# Patient Record
Sex: Female | Born: 1975 | Race: White | Hispanic: No | Marital: Married | Smoking: Never smoker
Health system: Southern US, Community
[De-identification: ages and names within clinical notes are randomized; demographics above are authoritative.]

## PROBLEM LIST (undated history)

## (undated) DIAGNOSIS — D509 Iron deficiency anemia, unspecified: Secondary | ICD-10-CM

## (undated) DIAGNOSIS — N939 Abnormal uterine and vaginal bleeding, unspecified: Secondary | ICD-10-CM

## (undated) DIAGNOSIS — K649 Unspecified hemorrhoids: Secondary | ICD-10-CM

## (undated) DIAGNOSIS — K449 Diaphragmatic hernia without obstruction or gangrene: Secondary | ICD-10-CM

## (undated) DIAGNOSIS — M79672 Pain in left foot: Secondary | ICD-10-CM

## (undated) DIAGNOSIS — Z9889 Other specified postprocedural states: Secondary | ICD-10-CM

## (undated) DIAGNOSIS — K2210 Ulcer of esophagus without bleeding: Secondary | ICD-10-CM

## (undated) DIAGNOSIS — T7840XA Allergy, unspecified, initial encounter: Secondary | ICD-10-CM

## (undated) DIAGNOSIS — D126 Benign neoplasm of colon, unspecified: Secondary | ICD-10-CM

## (undated) DIAGNOSIS — M722 Plantar fascial fibromatosis: Secondary | ICD-10-CM

## (undated) DIAGNOSIS — O24419 Gestational diabetes mellitus in pregnancy, unspecified control: Secondary | ICD-10-CM

## (undated) DIAGNOSIS — D649 Anemia, unspecified: Secondary | ICD-10-CM

## (undated) DIAGNOSIS — M25561 Pain in right knee: Secondary | ICD-10-CM

## (undated) HISTORY — DX: Plantar fascial fibromatosis: M72.2

## (undated) HISTORY — DX: Benign neoplasm of colon, unspecified: D12.6

## (undated) HISTORY — DX: Allergy, unspecified, initial encounter: T78.40XA

## (undated) HISTORY — DX: Gestational diabetes mellitus in pregnancy, unspecified control: O24.419

## (undated) HISTORY — DX: Pain in right knee: M25.561

## (undated) HISTORY — DX: Diaphragmatic hernia without obstruction or gangrene: K44.9

## (undated) HISTORY — PX: NM RENAL LASIX (ARMC HX): HXRAD1213

## (undated) HISTORY — DX: Ulcer of esophagus without bleeding: K22.10

## (undated) HISTORY — DX: Anemia, unspecified: D64.9

## (undated) HISTORY — DX: Iron deficiency anemia, unspecified: D50.9

## (undated) HISTORY — DX: Pain in left foot: M79.672

## (undated) HISTORY — DX: Abnormal uterine and vaginal bleeding, unspecified: N93.9

## (undated) HISTORY — DX: Unspecified hemorrhoids: K64.9

## (undated) HISTORY — PX: PLANTAR FASCIA RELEASE: SHX2239

## (undated) HISTORY — DX: Other specified postprocedural states: Z98.890

---
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
## 2009-11-21 HISTORY — PX: SHOULDER ARTHROSCOPY: SHX128

## 2015-02-24 LAB — COMPLETE METABOLIC PANEL WITH GFR
Calcium, Ser: 9.6
EGFR (Non-African Amer.): 114

## 2015-02-24 LAB — IBC PANEL
Ferritin: 19
Iron Saturation: 12
Iron: 49

## 2015-02-24 LAB — H. PYLORI ANTIBODY, IGG: H. pylori IgM: <0.4

## 2015-02-24 LAB — BASIC METABOLIC PANEL WITH GFR
BUN: 12 mg/dL (ref 4–21)
Creatinine: 0.7 mg/dL (ref 0.5–1.1)
Glucose: 76 mg/dL
Potassium: 4.4 mmol/L (ref 3.4–5.3)
Sodium: 142 mmol/L (ref 137–147)

## 2015-02-24 LAB — HEPATIC FUNCTION PANEL
ALT: 9 U/L (ref 7–35)
AST: 14 U/L (ref 13–35)

## 2015-02-24 LAB — CBC AND DIFFERENTIAL
HCT: 38 % (ref 36–46)
Hemoglobin: 12.5 g/dL (ref 12.0–16.0)
Platelets: 360 10*3/uL (ref 150–399)
WBC: 9.5 10^3/mL

## 2015-08-18 ENCOUNTER — Ambulatory Visit (INDEPENDENT_AMBULATORY_CARE_PROVIDER_SITE_OTHER): Payer: BLUE CROSS/BLUE SHIELD | Admitting: Osteopathic Medicine

## 2015-08-18 ENCOUNTER — Encounter: Payer: Self-pay | Admitting: Osteopathic Medicine

## 2015-08-18 VITALS — BP 132/81 | HR 75 | Wt 266.0 lb

## 2015-08-18 DIAGNOSIS — K219 Gastro-esophageal reflux disease without esophagitis: Secondary | ICD-10-CM

## 2015-08-18 DIAGNOSIS — Z131 Encounter for screening for diabetes mellitus: Secondary | ICD-10-CM

## 2015-08-18 DIAGNOSIS — K635 Polyp of colon: Secondary | ICD-10-CM

## 2015-08-18 DIAGNOSIS — E669 Obesity, unspecified: Secondary | ICD-10-CM

## 2015-08-18 DIAGNOSIS — D649 Anemia, unspecified: Secondary | ICD-10-CM | POA: Diagnosis not present

## 2015-08-18 DIAGNOSIS — Z79899 Other long term (current) drug therapy: Secondary | ICD-10-CM

## 2015-08-18 DIAGNOSIS — K2100 Gastro-esophageal reflux disease with esophagitis, without bleeding: Secondary | ICD-10-CM

## 2015-08-18 DIAGNOSIS — K449 Diaphragmatic hernia without obstruction or gangrene: Secondary | ICD-10-CM

## 2015-08-18 DIAGNOSIS — Z13220 Encounter for screening for lipoid disorders: Secondary | ICD-10-CM

## 2015-08-18 DIAGNOSIS — D125 Benign neoplasm of sigmoid colon: Secondary | ICD-10-CM

## 2015-08-18 HISTORY — DX: Diaphragmatic hernia without obstruction or gangrene: K44.9

## 2015-08-18 MED ORDER — FOLIVANE-F 125-1 MG PO CAPS: 1.0000 | ORAL_CAPSULE | Freq: Every day | ORAL | Status: DC

## 2015-08-18 MED ORDER — OMEPRAZOLE 40 MG PO CPDR: 40.0000 mg | DELAYED_RELEASE_CAPSULE | Freq: Every day | ORAL | Status: DC

## 2015-08-18 NOTE — Patient Instructions (Addendum)
Omeprazole is generally not a problem in pregnancy but there are safer medicines to consider. If this becomes a concern for you, let Dr Sheppard Coil or your GI secialist know and we will discuss other medications with you.   We will call you with lab results, remember to get you blood drawn fasting (no food/drink other than water or black coffee 8 - 12 hours, can take medications with water). Our lab opens at 8:00 am.   Call us if you have not heard back about a GI referral within one week.   If refills are needed, have your pharmacy send our office a request.

## 2015-08-18 NOTE — Progress Notes (Signed)
HPI: Kelly Bruce is a 39 y.o. female who presents to [facility]  today for chief complaint of:  Chief Complaint  Patient presents with  . Establish Care    Preventive care reviewed as below.  GERD - stable on current meds. Birth control - none, not actively trying to conceive but would be ok with getting pregnant. Monogamous with husband.    Past medical, social and family history reviewed: No past medical history on file. Past Surgical History  Procedure Laterality Date  . Cyst excision Right 1996  . Plantar fascia release Bilateral [id number]  . Shoulder arthroscopy  2011  . Nm  renal lasix  2 24f 2 ([facility] hx)     Social History  Substance Use Topics  . Smoking status: Never Smoker   . Smokeless tobacco: Not on file  . Alcohol Use: 0.6 oz/week    1 Standard drinks or equivalent per week   No family history on file.  Current Outpatient Prescriptions  Medication Sig Dispense Refill  . Fe Fum-FePoly-FA-Vit C-Vit B3 (FOLIVANE-F) 125-1 MG CAPS TAKE 1 CAPSULE(S) EVERY DAY BY ORAL ROUTE FOR 30 DAYS. Pt taking these daily   1  . omeprazole (PRILOSEC) 40 MG capsule TAKE 1 CAPSULE(S) TWICE A DAY BY MOUTH FOR 30 DAYS.  1   No current facility-administered medications for this visit.   Allergies not on file    Review of Systems: CONSTITUTIONAL: Neg fever/chills, no unintentional weight changes HEAD/EYES/EARS/NOSE/THROAT: No headache/vision change or hearing change, no sore throat (+)hay fever/allergies CARDIAC: No chest pain/pressure/palpitations, no orthopnea RESPIRATORY: No cough/shortness of breath/wheeze GASTROINTESTINAL: No nausea/vomiting/abdominal pain/blood in stool/diarrhea/constipation MUSCULOSKELETAL: No myalgia/arthralgia GENITOURINARY: No incontinence, No abnormal genital discharge, reports abnormal bleeding SKIN: No rash/wounds/concerning lesions HEM/ONC: No easy bruising/bleeding, no abnormal lymph node ENDOCRINE: No  polyuria/polydipsia/polyphagia, no heat/cold intolerance  NEUROLOGIC: No weakness/dizzines/slurred speech PSYCHIATRIC: No concerns with depression/anxiety or sleep problems    Exam:  BP 132/81 mmHg  Pulse 75  Wt 266 lb (120.657 kg)  SpO2 98% Constitutional: VSS, see above. General Appearance: alert, well-developed, well-nourished, NAD Eyes: Normal lids and conjunctive, non-icteric sclera, PERRLA Ears, Nose, Mouth, Throat: Normal external inspection ears/nares/mouth/lips/gums, Normal TM bilaterally, MMM, posterior pharynx without erythema/exudate Neck: No masses, trachea midline. No thyroid enlargement/tenderness/mass appreciated Respiratory: Normal respiratory effort. no wheeze/rhonchi/rales Cardiovascular: S1/S2 normal, no murmur/rub/gallop auscultated. RRR. No carotid bruit or JVD. No abdominal aortic bruit. Pedal pulse II/IV bilaterally DP and PT. No lower extremity edema. Gastrointestinal: Nontender, no masses. No hepatomegaly, no splenomegaly. No hernia appreciated. Bowel sounds normal. Rectal exam deferred.  Musculoskeletal: Gait normal. No clubbing/cyanosis of digits.  Neurological: No cranial nerve deficit on limited exam. Motor and sensation intact and symmetric Psychiatric: Normal judgment/insight. Normal mood and affect. Oriented x3.    No results found for this or any previous visit (from the past 72 hour(s)).    ASSESSMENT/PLAN:  Gastroesophageal reflux disease, esophagitis presence not specified - Plan: omeprazole (PRILOSEC) 40 MG capsule, Ambulatory referral to Gastroenterology  Anemia, unspecified anemia type - Plan: Fe Fum-FePoly-FA-Vit C-Vit B3 (FOLIVANE-F) 125-1 MG CAPS, CBC with Differential/Platelet  Lipid screening - Plan: Lipid panel  Diabetes mellitus screening  Medication management - Plan: COMPLETE METABOLIC PANEL WITH GFR  Obesity - Plan: TSH  Reflux esophagitis - Plan: Ambulatory referral to Gastroenterology  Polyp, sigmoid colon  Hiatal  hernia  RTC to discuss abnormal bleeding patterns. LMP 07/25/15.  FEMALE PREVENTIVE CARE  ANNUAL SCREENING/COUNSELING Tobacco - Never  Alcohol - social drinker Diet/Exercise -  HEALTHY HABITS DISCUSSED TO DECREASE CV RISK Sexual Health - Yes with female. STI - The patient denies history of sexually transmitted disease. INTERESTED IN STI TESTING - no Depression - PQH2 Negative Domestic violence concerns - no HTN SCREENING - SEE VITALS Vaccination status - SEE BELOW  INFECTIOUS DISEASE SCREENING HIV - all adults 15-65 - needs but declined GC/CT - sexually active - needs but declined HepC - born 40-1963 - does not need TB - if risk/required by employer - does not need  DISEASE SCREENING Lipid - (Low risk screen M35/F45; High risk screen M25/F35 if HTN, Tob, FH CHD M<55/F<65) - needs DM2 (45+ or Risk = FH 1st deg DM, Hx GDM, overweight/sedentary, high-risk ethnicity, HTN) - needs Osteoporosis - age 44+ or one sooner if risk - does not need  CANCER SCREENING Cervical - Pap q3 yr age 74+, Pap + HPV q5y age 74+ - PAP - does not need, normal 2 years ago Breast - Mammo age 56+ (C) and biennial age 48-75 (A) - 53 - does not need Lung - annual low dose CT Chest age 69-75 w/ 30+ PY, current/quit past 15 years - CT - does not need Colon - age 23+ or 37 years of age prior to [place] Dx - GI REFERRAL - does not need, had Hx polys, needs colonoscopy age 21  ADULT VACCINATION Influenza - annual - was offered and declined by the patient Td booster every 10 years - was offered and declined by the patient HPV - age <35yo - was not indicated Zoster - age 76+ - was not indicated Pneumonia - age 26+ sooner if risk (DM, smoker, other) - was not indicated  OTHER Fall - exercise and Vit D age 15+ - does not need Consider ASA - age 40-59 - does not need

## 2015-08-19 ENCOUNTER — Encounter: Payer: Self-pay | Admitting: Osteopathic Medicine

## 2015-08-19 ENCOUNTER — Encounter: Payer: Self-pay | Admitting: Gastroenterology

## 2015-08-19 DIAGNOSIS — M722 Plantar fascial fibromatosis: Secondary | ICD-10-CM

## 2015-08-19 DIAGNOSIS — R198 Other specified symptoms and signs involving the digestive system and abdomen: Secondary | ICD-10-CM | POA: Insufficient documentation

## 2015-08-19 DIAGNOSIS — Z9889 Other specified postprocedural states: Secondary | ICD-10-CM

## 2015-08-19 HISTORY — DX: Plantar fascial fibromatosis: M72.2

## 2015-08-19 HISTORY — DX: Other specified postprocedural states: Z98.890

## 2015-08-20 LAB — CBC WITH DIFFERENTIAL/PLATELET
Basophils Absolute: 0 10*3/uL (ref 0.0–0.1)
Basophils Relative: 0 % (ref 0–1)
Eosinophils Absolute: 0.1 10*3/uL (ref 0.0–0.7)
Eosinophils Relative: 1 % (ref 0–5)
HCT: 37.7 % (ref 36.0–46.0)
Hemoglobin: 12.3 g/dL (ref 12.0–15.0)
Lymphocytes Relative: 29 % (ref 12–46)
Lymphs Abs: 2.2 10*3/uL (ref 0.7–4.0)
MCH: 24.7 pg — ABNORMAL LOW (ref 26.0–34.0)
MCHC: 32.6 g/dL (ref 30.0–36.0)
MCV: 75.7 fL — ABNORMAL LOW (ref 78.0–100.0)
MPV: 8.9 fL (ref 8.6–12.4)
Monocytes Absolute: 0.6 10*3/uL (ref 0.1–1.0)
Monocytes Relative: 8 % (ref 3–12)
Neutro Abs: 4.8 10*3/uL (ref 1.7–7.7)
Neutrophils Relative %: 62 % (ref 43–77)
Platelets: 291 10*3/uL (ref 150–400)
RBC: 4.98 MIL/uL (ref 3.87–5.11)
RDW: 16.7 % — ABNORMAL HIGH (ref 11.5–15.5)
WBC: 7.7 10*3/uL (ref 4.0–10.5)

## 2015-08-20 LAB — LIPID PANEL
Cholesterol: 175 mg/dL (ref 125–200)
HDL: 45 mg/dL — ABNORMAL LOW (ref >=46)
LDL Cholesterol: 109 mg/dL (ref <130)
Total CHOL/HDL Ratio: 3.9 Ratio (ref <=5.0)
Triglycerides: 105 mg/dL (ref <150)
VLDL: 21 mg/dL (ref <30)

## 2015-08-20 LAB — COMPLETE METABOLIC PANEL WITH GFR
ALT: 11 U/L (ref 6–29)
AST: 13 U/L (ref 10–30)
Albumin: 4 g/dL (ref 3.6–5.1)
Alkaline Phosphatase: 57 U/L (ref 33–115)
BUN: 8 mg/dL (ref 7–25)
CO2: 25 mmol/L (ref 20–31)
Calcium: 9.2 mg/dL (ref 8.6–10.2)
Chloride: 106 mmol/L (ref 98–110)
Creat: 0.68 mg/dL (ref 0.50–1.10)
GFR, Est African American: >89 mL/min (ref >=60)
GFR, Est Non African American: >89 mL/min (ref >=60)
Glucose, Bld: 74 mg/dL (ref 65–99)
Potassium: 4.5 mmol/L (ref 3.5–5.3)
Sodium: 139 mmol/L (ref 135–146)
Total Bilirubin: 0.4 mg/dL (ref 0.2–1.2)
Total Protein: 6.8 g/dL (ref 6.1–8.1)

## 2015-08-21 LAB — TSH: TSH: 1.503 u[IU]/mL (ref 0.350–4.500)

## 2015-10-13 ENCOUNTER — Encounter: Payer: Self-pay | Admitting: Gastroenterology

## 2015-10-13 ENCOUNTER — Ambulatory Visit (INDEPENDENT_AMBULATORY_CARE_PROVIDER_SITE_OTHER): Payer: BLUE CROSS/BLUE SHIELD | Admitting: Gastroenterology

## 2015-10-13 VITALS — BP 130/90 | HR 64 | Ht 64.0 in | Wt 264.2 lb

## 2015-10-13 DIAGNOSIS — K219 Gastro-esophageal reflux disease without esophagitis: Secondary | ICD-10-CM | POA: Diagnosis not present

## 2015-10-13 DIAGNOSIS — D126 Benign neoplasm of colon, unspecified: Secondary | ICD-10-CM

## 2015-10-13 MED ORDER — OMEPRAZOLE 40 MG PO CPDR: 40.0000 mg | DELAYED_RELEASE_CAPSULE | Freq: Every day | ORAL | Status: DC

## 2015-10-13 NOTE — Progress Notes (Signed)
Kelly Bruce    [id number]    [birth year]  Primary Care Physician:Kelly Sheppard Coil, DO  Referring Physician: Emeterio Reeve, DO [id number] [place], [place] [address]  Chief complaint:  GERD  HPI:  39 year old female with history of GERD here to establish care. She was evaluated by Dr. 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 Patient has been on PPI with no symptoms but when she stops it, she has nausea and epigastric pain. Her weight has been stable. She is trying to lose weight. Denies any dysphagia, odynophagia, vomiting, abdominal pain, constipation, diarrhea or blood in stool.  Denies smoking, occasional social alcohol intake and no history of drugs . She works at [employer] .  Outpatient Encounter Prescriptions as of 10/13/2015  Medication Sig  . Fe Fum-FePoly-FA-Vit C-Vit B3 (FOLIVANE-F) 125-1 MG CAPS Take 1 tablet by mouth daily.  Marland Kitchen omeprazole (PRILOSEC) 40 MG capsule Take 1 capsule (40 mg total) by mouth daily.   No facility-administered encounter medications on file as of 10/13/2015.    Allergies as of 10/13/2015  . (No Known Allergies)    Past Medical History  Diagnosis Date  . Allergy   . Vaginal bleeding, abnormal   . Polyp, sigmoid colon 08/18/2015  . Hiatal hernia 08/18/2015    EGD 06/09/14  . H/O shoulder surgery 08/19/2015  . 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 Cyst excision Right 1996  . Plantar fascia release Bilateral [id number]  . Shoulder arthroscopy  2011  . Nm  renal lasix  2 31f 2 ([facility] hx)      Family History  Problem Relation Age of Onset  . Depression Mother   . Mental illness Mother   .  Bipolar disorder Mother   . Hypertension Mother   . Heart attack Father   . Diabetes Father   . Stroke Maternal Grandmother   . Stroke Paternal Grandmother   . Heart attack Paternal Grandfather     Social History   Social History  . Marital Status: Married    Spouse Name: Kelly Bruce  . Number of Children: 0  . 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 Smoking status: Never Smoker   . Smokeless tobacco: Not on file  . Alcohol Use: 0.6 oz/week    1 Standard drinks or equivalent per week  . Drug Use: No  . Sexual Activity:    Partners: Male    Birth Control/ Protection: None   Other Topics Concern  . Not on file   Social History Narrative      Review of systems: Review of Systems  Constitutional: Negative for fever and chills.  HENT: Negative.   Eyes: Negative for blurred vision.  Respiratory: Negative for cough, shortness of breath and wheezing.   Cardiovascular: Negative for chest pain and palpitations.  Gastrointestinal: as per HPI Genitourinary: Negative for dysuria, urgency, frequency and hematuria.  Musculoskeletal: Negative for myalgias, back pain and joint pain.  Skin: Negative for itching and rash.  Neurological: Negative for dizziness, tremors, focal weakness, seizures and loss  of consciousness.  Endo/Heme/Allergies: Negative for environmental allergies.  Psychiatric/Behavioral: Negative for depression, suicidal ideas and hallucinations.  All other systems reviewed and are negative.   Physical Exam: There were no vitals filed for this visit. Gen:      No acute distress, morbidly obese HEENT:  EOMI, sclera anicteric Neck:     No masses; no thyromegaly Lungs:    Clear to auscultation bilaterally; normal respiratory effort CV:         Regular rate and rhythm; no murmurs Abd:      + bowel sounds; soft, non-tender; no palpable masses, no distension Ext:    No edema; adequate  peripheral perfusion Skin:      Warm and dry; no rash Neuro: alert and oriented x 3 Psych: normal mood and affect  Data Reviewed:  EGD and colonoscopy 05/2015 Details as per HPI   Assessment and Plan/Recommendations: 39 year old female with morbid obesity and history of GERD here to establish care Continue PPI daily discussed antireflux measures (no meals 3 hours before bedtime,  sleep with head end elevation) Will schedule for repeat EGD to evaluate for complete healing of esophagitis and gastric ulcer She is due for recall colonoscopy in 5 years Discussed diet and exercise to loose weight If continues to have persistent esophagitis, may have to consider increasing the PPI dose, esophageal manometry with 24-hour pH impedance  Patient is hesitant about long-term PPI use and would like to consider surgical options. We will further discuss after the endoscopy.  Kelly Bruce , MD [phone] Mon-Fri 8a-5p [phone] after 5p, weekends, holidays

## 2015-10-13 NOTE — Patient Instructions (Addendum)
You have been scheduled for an endoscopy. Please follow written instructions given to you at your visit today. If you use inhalers (even only as needed), please bring them with you on the day of your procedure. Your physician has requested that you go to www.startemmi.com and enter the access code given to you at your visit today. This web site gives a general overview about your procedure. However, you should still follow specific instructions given to you by our office regarding your preparation for the procedure.  We will refill your Prilosec Your Follow up with Dr Silverio Decamp after Endoscopy is scheduled on 12-15-2015 at 2:45pm

## 2015-11-04 ENCOUNTER — Encounter: Payer: Self-pay | Admitting: Gastroenterology

## 2015-11-18 ENCOUNTER — Encounter: Payer: Self-pay | Admitting: Gastroenterology

## 2015-11-18 ENCOUNTER — Ambulatory Visit (AMBULATORY_SURGERY_CENTER): Payer: BLUE CROSS/BLUE SHIELD | Admitting: Gastroenterology

## 2015-11-18 ENCOUNTER — Other Ambulatory Visit: Payer: Self-pay | Admitting: Gastroenterology

## 2015-11-18 VITALS — BP 117/73 | HR 76 | Temp 98.9°F | Resp 19 | Ht 64.0 in | Wt 264.0 lb

## 2015-11-18 DIAGNOSIS — K219 Gastro-esophageal reflux disease without esophagitis: Secondary | ICD-10-CM

## 2015-11-18 MED ORDER — SODIUM CHLORIDE 0.9 % IV SOLN: 500.0000 mL | INTRAVENOUS | Status: DC

## 2015-11-18 NOTE — Op Note (Signed)
[facility]  [address] [place], [address]   ENDOSCOPY PROCEDURE REPORT  PATIENT: Kelly Bruce, Kelly Bruce  MR#: [id number] BIRTHDATE: [birth year] , 39  yrs. old GENDER: female ENDOSCOPIST: Harl Bowie, MD REFERRED BY:  Emeterio Reeve, MD PROCEDURE DATE:  11/18/2015 PROCEDURE:  EGD w/ biopsy ASA CLASS:     Class II INDICATIONS:  dyspepsia and heartburn. MEDICATIONS: Propofol 300 mg IV TOPICAL ANESTHETIC: none  DESCRIPTION OF PROCEDURE: After the risks benefits and alternatives of the procedure were thoroughly explained, informed consent was obtained.  The LB [id number] endoscope was introduced through the mouth and advanced to the second portion of the duodenum , Without limitations.  The instrument was slowly withdrawn as the mucosa was fully examined.    Esophageal mucosa appeared normal, widely open Schatzki's ring at squamocolumnar junction at 32 cm from incisors, sliding hiatal hernia but 4 cm in size.  Multiple small gastric polyps 3-5 mm in size, 3 polyps removed for sampling.  Duodenal mucosa appeared normal.  Retroflexed views revealed as previously described. The scope was then withdrawn from the patient and the procedure completed.  COMPLICATIONS: There were no immediate complications.  ENDOSCOPIC IMPRESSION: Esophageal mucosa appeared normal, widely open Schatzki's ring at squamocolumnar junction at 32 cm from incisors, sliding hiatal hernia but 4 cm in size.  Multiple small gastric polyps 3-5 mm in size, 3 polyps removed for sampling.  Duodenal mucosa appeared normal  RECOMMENDATIONS: 1.  Anti-reflux regimen to be follow 2.  Continue PPI 3.  Await pathology results 4.  follow-up office visit 5.Esophageal manometry with 24-hour pH impedance on PPI  eSigned:  Harl Bowie, MD 11/18/2015 2:13 PM

## 2015-11-18 NOTE — Patient Instructions (Signed)
YOU HAD AN ENDOSCOPIC PROCEDURE TODAY AT THE [facility]:   Refer to the procedure report that was given to you for any specific questions about what was found during the examination.  If the procedure report does not answer your questions, please call your gastroenterologist to clarify.  If you requested that your care partner not be given the details of your procedure findings, then the procedure report has been included in a sealed envelope for you to review at your convenience later.  YOU SHOULD EXPECT: Some feelings of bloating in the abdomen. Passage of more gas than usual.  Walking can help get rid of the air that was put into your GI tract during the procedure and reduce the bloating. If you had a lower endoscopy (such as a colonoscopy or flexible sigmoidoscopy) you may notice spotting of blood in your stool or on the toilet paper. If you underwent a bowel prep for your procedure, you may not have a normal bowel movement for a few days.  Please Note:  You might notice some irritation and congestion in your nose or some drainage.  This is from the oxygen used during your procedure.  There is no need for concern and it should clear up in a day or so.  SYMPTOMS TO REPORT IMMEDIATELY:     Following upper endoscopy (EGD)  Vomiting of blood or coffee ground material  New chest pain or pain under the shoulder blades  Painful or persistently difficult swallowing  New shortness of breath  Fever of 100F or higher  Black, tarry-looking stools  For urgent or emergent issues, a gastroenterologist can be reached at any hour by calling [phone].   DIET: Your first meal following the procedure should be a small meal and then it is ok to progress to your normal diet. Heavy or fried foods are harder to digest and may make you feel nauseous or bloated.  Likewise, meals heavy in dairy and vegetables can increase bloating.  Drink plenty of fluids but you should avoid alcoholic beverages  for 24 hours.  ACTIVITY:  You should plan to take it easy for the rest of today and you should NOT DRIVE or use heavy machinery until tomorrow (because of the sedation medicines used during the test).    FOLLOW UP: Our staff will call the number listed on your records the next business day following your procedure to check on you and address any questions or concerns that you may have regarding the information given to you following your procedure. If we do not reach you, we will leave a message.  However, if you are feeling well and you are not experiencing any problems, there is no need to return our call.  We will assume that you have returned to your regular daily activities without incident.  If any biopsies were taken you will be contacted by phone or by letter within the next 1-3 weeks.  Please call us at [phone] if you have not heard about the biopsies in 3 weeks.    SIGNATURES/CONFIDENTIALITY: You and/or your care partner have signed paperwork which will be entered into your electronic medical record.  These signatures attest to the fact that that the information above on your After Visit Summary has been reviewed and is understood.  Full responsibility of the confidentiality of this discharge information lies with you and/or your care-partner.   Resume medications. Information given on Hiatal Hernia. 

## 2015-11-18 NOTE — Progress Notes (Signed)
To recovery, report to Brown, RN, VSS. 

## 2015-11-18 NOTE — Progress Notes (Signed)
Called to room to assist during endoscopic procedure.  Patient ID and intended procedure confirmed with present staff. Received instructions for my participation in the procedure from the performing physician.  

## 2015-11-19 ENCOUNTER — Telehealth: Payer: Self-pay | Admitting: *Deleted

## 2015-11-19 NOTE — Telephone Encounter (Signed)
  Follow up Call-  Call back number 11/18/2015  Post procedure Call Back phone  # [phone]  Permission to leave phone message Yes     Patient questions:  Do you have a fever, pain , or abdominal swelling? No. Pain Score  0 *  Have you tolerated food without any problems? Yes.    Have you been able to return to your normal activities? Yes.    Do you have any questions about your discharge instructions: Diet   No. Medications  No. Follow up visit  No.  Do you have questions or concerns about your Care? No.  Actions: * If pain score is 4 or above: No action needed, pain <4.

## 2015-11-25 ENCOUNTER — Other Ambulatory Visit: Payer: Self-pay | Admitting: Osteopathic Medicine

## 2015-12-01 ENCOUNTER — Encounter: Payer: Self-pay | Admitting: Gastroenterology

## 2015-12-15 ENCOUNTER — Ambulatory Visit: Payer: BLUE CROSS/BLUE SHIELD | Admitting: Gastroenterology

## 2015-12-16 ENCOUNTER — Telehealth: Payer: Self-pay

## 2015-12-16 NOTE — Telephone Encounter (Signed)
Patient is supposed to be scheduled for an esophageal manometry with 24 hour pH impedance on PPI. Left a message to call back. Her EGD was 12/18/14. I have left message for the patient to call back.

## 2016-02-03 ENCOUNTER — Ambulatory Visit: Payer: BLUE CROSS/BLUE SHIELD | Admitting: Gastroenterology

## 2016-02-03 ENCOUNTER — Telehealth: Payer: Self-pay | Admitting: Gastroenterology

## 2016-02-03 NOTE — Telephone Encounter (Signed)
Discussed purpose of the appointment is to recheck GERD and discuss what was found with the testing. Without full information on her causes, it will be difficult to determine the best treatment plan. The hospital cancelled and rescheduled her esophageal manometry. I rescheduled her return appointment with Dr Silverio Decamp.

## 2016-02-22 ENCOUNTER — Ambulatory Visit (HOSPITAL_COMMUNITY)
Admission: RE | Admit: 2016-02-22 | Discharge: 2016-02-22 | Disposition: A | Discharge status: Home / Self Care | Payer: BLUE CROSS/BLUE SHIELD | Source: Ambulatory Visit | Attending: Gastroenterology | Admitting: Gastroenterology

## 2016-02-22 ENCOUNTER — Encounter (HOSPITAL_COMMUNITY)
Admission: RE | Disposition: A | Discharge status: Home / Self Care | Payer: Self-pay | Source: Ambulatory Visit | Attending: Gastroenterology

## 2016-02-22 DIAGNOSIS — R12 Heartburn: Secondary | ICD-10-CM | POA: Diagnosis not present

## 2016-02-22 DIAGNOSIS — R1310 Dysphagia, unspecified: Secondary | ICD-10-CM | POA: Diagnosis not present

## 2016-02-22 DIAGNOSIS — K449 Diaphragmatic hernia without obstruction or gangrene: Secondary | ICD-10-CM | POA: Diagnosis not present

## 2016-02-22 DIAGNOSIS — K219 Gastro-esophageal reflux disease without esophagitis: Secondary | ICD-10-CM

## 2016-02-22 DIAGNOSIS — R079 Chest pain, unspecified: Secondary | ICD-10-CM | POA: Insufficient documentation

## 2016-02-22 HISTORY — PX: ESOPHAGEAL MANOMETRY: SHX5429

## 2016-02-22 HISTORY — PX: PH IMPEDANCE STUDY: SHX5565

## 2016-02-22 SURGERY — MANOMETRY, ESOPHAGUS

## 2016-02-22 MED ORDER — LIDOCAINE VISCOUS 2 % MT SOLN
OROMUCOSAL | Status: AC
  Filled 2016-02-22: qty 15

## 2016-02-22 SURGICAL SUPPLY — 2 items
FACESHIELD LNG OPTICON STERILE (SAFETY) IMPLANT
GLOVE BIO SURGEON STRL SZ8 (GLOVE) ×4 IMPLANT

## 2016-02-24 ENCOUNTER — Encounter (HOSPITAL_COMMUNITY): Payer: Self-pay | Admitting: Gastroenterology

## 2016-02-25 DIAGNOSIS — R1310 Dysphagia, unspecified: Secondary | ICD-10-CM | POA: Insufficient documentation

## 2016-03-03 ENCOUNTER — Other Ambulatory Visit: Payer: Self-pay | Admitting: Osteopathic Medicine

## 2016-03-18 ENCOUNTER — Encounter: Payer: Self-pay | Admitting: Gastroenterology

## 2016-03-18 ENCOUNTER — Ambulatory Visit (INDEPENDENT_AMBULATORY_CARE_PROVIDER_SITE_OTHER): Payer: BLUE CROSS/BLUE SHIELD | Admitting: Gastroenterology

## 2016-03-18 VITALS — BP 110/78 | HR 66 | Ht 65.0 in | Wt 255.0 lb

## 2016-03-18 DIAGNOSIS — K219 Gastro-esophageal reflux disease without esophagitis: Secondary | ICD-10-CM

## 2016-03-18 MED ORDER — OMEPRAZOLE 40 MG PO CPDR: 40.0000 mg | DELAYED_RELEASE_CAPSULE | Freq: Every day | ORAL | Status: DC

## 2016-03-18 NOTE — Patient Instructions (Signed)
Follow up in a year  We will refill your medications

## 2016-03-18 NOTE — Progress Notes (Signed)
Kaycee Yim    [id number]    July 30, 1976  Primary Care Physician:Natalie Sheppard Coil, DO  Referring Physician: Emeterio Reeve, DO [id number] [place], [place] [address]  Chief complaint:  GERD  HPI: 20 yr F with h/o obesity is here for follow up of GERD. She had esophageal manometry that showed hypotensive lower esophageal sphincter and evidence of hiatal hernia otherwise no significant peristaltic abnormality. 24 hr pH impedance on PPI showed good acid suppression and evidence of slightly increased weakly acid reflux. She is doing well on PPI once daily and denies any breakthrough heartburn. Denies any dysphagia, odynophagia,nausea, vomiting, abdominal pain, melena or bright red blood per rectum. She has lost 8 pounds so far and is planning to change her lifestyle to lose more weight    Outpatient Encounter Prescriptions as of 03/18/2016  Medication Sig  . Fe Fum-FePoly-FA-Vit C-Vit B3 (FOLIVANE-F) 125-1 MG CAPS TAKE ONE CAPSULE BY MOUTH EVERY DAY  . omeprazole (PRILOSEC) 40 MG capsule Take 1 capsule (40 mg total) by mouth daily.   No facility-administered encounter medications on file as of 03/18/2016.    Allergies as of 03/18/2016  . (No Known Allergies)    Past Medical History  Diagnosis Date  . Allergy   . Vaginal bleeding, abnormal   . Hiatal hernia 08/18/2015    EGD 06/09/14  . H/O shoulder surgery 08/19/2015  . 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 Adenomatous colon polyp   . Iron deficiency anemia   . Erosive esophagitis   . Hemorrhoids     Past Surgical History  Procedure Laterality Date  . Cyst excision Right 1996    wrist  . Plantar fascia release Bilateral 2006,2007  . Shoulder arthroscopy  2011  . Nm renal lasix ([facility] hx)    . Esophageal manometry N/A 02/22/2016    Procedure: ESOPHAGEAL MANOMETRY (EM);  Surgeon: Mauri Pole, MD;  Location: [facility];  Service: Endoscopy;   Laterality: N/A;  . Ph impedance study N/A 02/22/2016    Procedure: [place] IMPEDANCE STUDY;  Surgeon: Mauri Pole, MD;  Location: [facility];  Service: Endoscopy;  Laterality: N/A;    Family History  Problem Relation Age of Onset  . Depression Mother   . Mental illness Mother   . Bipolar disorder Mother   . Hypertension Mother   . Heart attack Father   . Diabetes Father   . Stroke Maternal Grandmother   . Stroke Paternal Grandmother   . Heart attack Paternal Grandfather     Social History   Social History  . Marital Status: Married    Spouse Name: Azaela Gerbig  . Number of Children: 0  . 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 Smoking status: Never Smoker   . Smokeless tobacco: Never Used  . Alcohol Use: 0.6 oz/week    1 Standard drinks or equivalent per week  . Drug Use: No  . Sexual Activity:    Partners: Male    Birth Control/ Protection: None   Other Topics Concern  . Not on file   Social History Narrative      Review of systems: Review of Systems  Constitutional: Negative for fever and chills.  HENT: Negative.   Eyes: Negative for blurred vision.  Respiratory: Negative for cough, shortness of breath and wheezing.  Cardiovascular: Negative for chest pain and palpitations.  Gastrointestinal: as per HPI Genitourinary: Negative for dysuria, urgency, frequency and hematuria.  Musculoskeletal: Negative for myalgias, back pain and joint pain.  Skin: Negative for itching and rash.  Neurological: Negative for dizziness, tremors, focal weakness, seizures and loss of consciousness.  Endo/Heme/Allergies: Negative for environmental allergies.  Psychiatric/Behavioral: Negative for depression, suicidal ideas and hallucinations.  All other systems reviewed and are negative.   Physical Exam: Filed Vitals:   03/18/16 1139  BP: 110/78  Pulse: 66   Gen:      No acute distress,  obese HEENT:  EOMI, sclera anicteric Neck:     No masses; no thyromegaly Lungs:    Clear to auscultation bilaterally; normal respiratory effort CV:         Regular rate and rhythm; no murmurs Abd:      + bowel sounds; soft, non-tender; no palpable masses, no distension Ext:    No edema; adequate peripheral perfusion Skin:      Warm and dry; no rash Neuro: alert and oriented x 3 Psych: normal mood and affect  Data Reviewed:  Reviewed chart in epic and relevant past GI work up   Assessment and Plan/Recommendations: 29 yr F with obesity and chronic GERD here for follow up visit Her reflux symptoms are well controlled with PPI daily with minimal or only occasional break through symptoms No evidence of ant significant esophageal dysmotility Discussed diet and exercise with goal weight loss ~10% body weigh in next 3-6 months Continue PPI daily and anti reflux measures Return in 1 year  K. Denzil Magnuson , MD [phone] Mon-Fri 8a-5p [phone] after 5p, weekends, holidays

## 2016-05-24 ENCOUNTER — Other Ambulatory Visit: Payer: Self-pay | Admitting: Osteopathic Medicine

## 2016-10-24 ENCOUNTER — Other Ambulatory Visit: Payer: Self-pay | Admitting: Gastroenterology

## 2016-10-24 DIAGNOSIS — K219 Gastro-esophageal reflux disease without esophagitis: Secondary | ICD-10-CM

## 2016-12-26 ENCOUNTER — Telehealth: Payer: Self-pay | Admitting: *Deleted

## 2016-12-26 NOTE — Telephone Encounter (Signed)
Patient request omeprazole 90 day supply  Approved and sent to [facility]

## 2016-12-27 ENCOUNTER — Ambulatory Visit (INDEPENDENT_AMBULATORY_CARE_PROVIDER_SITE_OTHER): Payer: BLUE CROSS/BLUE SHIELD | Admitting: Physician Assistant

## 2016-12-27 VITALS — BP 133/69 | HR 71 | Temp 98.6°F | Wt 251.0 lb

## 2016-12-27 DIAGNOSIS — L03211 Cellulitis of face: Secondary | ICD-10-CM | POA: Diagnosis not present

## 2016-12-27 MED ORDER — CLINDAMYCIN HCL 300 MG PO CAPS: 300.0000 mg | ORAL_CAPSULE | Freq: Three times a day (TID) | ORAL | 0 refills | Status: AC

## 2016-12-27 NOTE — Patient Instructions (Addendum)
Antibiotic: 1 pill, three times daily Warm compresses three times daily Return for fevers, chills, or no improvement after antibiotic treatment  Skin Abscess A skin abscess is an infected area on or under your skin that contains a collection of pus and other material. An abscess may also be called a furuncle, carbuncle, or boil. An abscess can occur in or on almost any part of your body. Some abscesses break open (rupture) on their own. Most continue to get worse unless they are treated. The infection can spread deeper into the body and eventually into your blood, which can make you feel ill. Treatment usually involves draining the abscess. What are the causes? An abscess occurs when germs, often bacteria, pass through your skin and cause an infection. This may be caused by:  A scrape or cut on your skin.  A puncture wound through your skin, including a needle injection.  Blocked oil or sweat glands.  Blocked and infected hair follicles.  A cyst that forms beneath your skin (sebaceous cyst) and becomes infected. What increases the risk? This condition is more likely to develop in people who:  Have a weak body defense system (immune system).  Have diabetes.  Have dry and irritated skin.  Get frequent injections or use illegal IV drugs.  Have a foreign body in a wound, such as a splinter.  Have problems with their lymph system or veins. What are the signs or symptoms? An abscess may start as a painful, firm bump under the skin. Over time, the abscess may get larger or become softer. Pus may appear at the top of the abscess, causing pressure and pain. It may eventually break through the skin and drain. Other symptoms include:  Redness.  Warmth.  Swelling.  Tenderness.  A sore on the skin. How is this diagnosed? This condition is diagnosed based on your medical history and a physical exam. A sample of pus may be taken from the abscess to find out what is causing the  infection and what antibiotics can be used to treat it. You also may have:  Blood tests to look for signs of infection or spread of an infection to your blood.  Imaging studies such as ultrasound, CT scan, or MRI if the abscess is deep. How is this treated? Small abscesses that drain on their own may not need treatment. Treatment for an abscess that does not rupture on its own may include:  Warm compresses applied to the area several times per day.  Incision and drainage. Your health care provider will make an incision to open the abscess and will remove pus and any foreign body or dead tissue. The incision area may be packed with gauze to keep it open for a few days while it heals.  Antibiotic medicines to treat infection. For a severe abscess, you may first get antibiotics through an IV and then change to oral antibiotics. Follow these instructions at home: Abscess Care  If you have an abscess that has not drained, place a warm, clean, wet washcloth over the abscess several times a day. Do this as told by your health care provider.  Follow instructions from your health care provider about how to take care of your abscess. Make sure you:  Cover the abscess with a bandage (dressing).  Change your dressing or gauze as told by your health care provider.  Wash your hands with soap and water before you change the dressing or gauze. If soap and water are not available, use  hand sanitizer.  Check your abscess every day for signs of a worsening infection. Check for:  More redness, swelling, or pain.  More fluid or blood.  Warmth.  More pus or a bad smell. Medicines  Take over-the-counter and prescription medicines only as told by your health care provider.  If you were prescribed an antibiotic medicine, take it as told by your health care provider. Do not stop taking the antibiotic even if you start to feel better. General instructions  To avoid spreading the infection:  Do not  share personal care items, towels, or hot tubs with others.  Avoid making skin contact with other people.  Keep all follow-up visits as told by your health care provider. This is important. Contact a health care provider if:  You have more redness, swelling, or pain around your abscess.  You have more fluid or blood coming from your abscess.  Your abscess feels warm to the touch.  You have more pus or a bad smell coming from your abscess.  You have a fever.  You have muscle aches.  You have chills or a general ill feeling. Get help right away if:  You have severe pain.  You see red streaks on your skin spreading away from the abscess. This information is not intended to replace advice given to you by your health care provider. Make sure you discuss any questions you have with your health care provider. Document Released: 08/17/2005 Document Revised: 07/03/2016 Document Reviewed: 09/16/2015 Elsevier Interactive Patient Education  2017 [employer].

## 2016-12-27 NOTE — Progress Notes (Signed)
HPI:                                                                Kelly Bruce is a 41 y.o. female who presents to [facility]: [facility] today for lesion on left cheek  Patient GA 23w presents with left-sided facial redness, swelling and warmth x 5 days that is gradually worsening. She also endorses tender left-sided lymph node. She has tried topical hydrocortisone cream without relief. Denies fever or chills.   Past Medical History:  Diagnosis Date  . Adenomatous colon polyp   . Allergy   . Erosive esophagitis   . H/O shoulder surgery 08/19/2015  . Hemorrhoids   . Hiatal hernia 08/18/2015   EGD 06/09/14  . Iron deficiency anemia   . 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 Vaginal bleeding, abnormal    Past Surgical History:  Procedure Laterality Date  . CYST EXCISION Right 1996   wrist  . ESOPHAGEAL MANOMETRY N/A 02/22/2016   Procedure: ESOPHAGEAL MANOMETRY (EM);  Surgeon: Mauri Pole, MD;  Location: [facility];  Service: Endoscopy;  Laterality: N/A;  . NM RENAL LASIX ([place] HX)    . [place] IMPEDANCE STUDY N/A 02/22/2016   Procedure: [place] IMPEDANCE STUDY;  Surgeon: Mauri Pole, MD;  Location: [facility];  Service: Endoscopy;  Laterality: N/A;  . PLANTAR FASCIA RELEASE Bilateral [id number]  . SHOULDER ARTHROSCOPY  2011   Social History  Substance Use Topics  . Smoking status: Never Smoker  . Smokeless tobacco: Never Used  . Alcohol use 0.6 oz/week    1 Standard drinks or equivalent per week   family history includes Bipolar disorder in her mother; Depression in her mother; Diabetes in her father; Heart attack in her father and paternal grandfather; Hypertension in her mother; Mental illness in her mother; Stroke in her maternal grandmother and paternal grandmother.  ROS: negative except as noted in the HPI  Medications: Current Outpatient Prescriptions  Medication Sig Dispense Refill  .  Fe Fum-FePoly-FA-Vit C-Vit B3 (FOLIVANE-F) 125-1 MG CAPS TAKE ONE CAPSULE BY MOUTH EVERY DAY 30 capsule 1  . omeprazole (PRILOSEC) 40 MG capsule TAKE ONE CAPSULE BY MOUTH EVERY DAY 30 capsule 2   No current facility-administered medications for this visit.    No Known Allergies     Objective:  BP 133/69   Pulse 71   Temp 98.6 F (37 C) (Oral)   Wt 251 lb (113.9 kg)   BMI 41.77 kg/m  Gen: well-groomed, obese, cooperative, not ill-appearing, no distress HEENT: normal conjunctiva, oropharynx clear, moist mucus membranes, normal buccal mucosa Pulm: Normal work of breathing, normal phonation, clear to auscultation bilaterally, no wheezes, rales or rhonchi CV: Normal rate, regular rhythm, s1 and s2 distinct, no murmurs, clicks or rubs  Neuro: alert and oriented x 3, EOM's intact Lymph: left-sided tonsillar adenopathy present, no preauricular, submandibular, submental or cervical adenopathy Skin: warm and dry, approx 3 cm round area of erythema with fluctuance and warmth on left cheek   No results found for this or any previous visit (from the past 72 hour(s)). No results found.    Assessment and Plan: 41 y.o. female with   1. Cellulitis of left external cheek -  patient may need incision and drainage, but given location and concerns for cosmesis, will defer for now and treat empirically with antibiotics - warm compresses tid - patient instructed to return if worsening symptoms or no improvement with antibiotic therapy - clindamycin (CLEOCIN) 300 MG capsule; Take 1 capsule (300 mg total) by mouth 3 (three) times daily.  Dispense: 21 capsule; Refill: 0  Patient education and anticipatory guidance given Patient agrees with treatment plan Follow-up as needed if symptoms worsen or fail to improve  Darlyne Russian PA-C

## 2017-10-04 ENCOUNTER — Encounter: Payer: Self-pay | Admitting: Osteopathic Medicine

## 2017-10-04 ENCOUNTER — Ambulatory Visit: Payer: BLUE CROSS/BLUE SHIELD | Admitting: Osteopathic Medicine

## 2017-10-04 VITALS — BP 128/87 | HR 69 | Wt 256.0 lb

## 2017-10-04 DIAGNOSIS — M654 Radial styloid tenosynovitis [de Quervain]: Secondary | ICD-10-CM

## 2017-10-04 MED ORDER — DICLOFENAC SODIUM 1 % TD GEL: 2.0000 g | Freq: Four times a day (QID) | TRANSDERMAL | 11 refills | Status: DC

## 2017-10-04 NOTE — Patient Instructions (Signed)
De Quervain Disease De Quervain disease is inflammation of the tendon on the thumb side of the wrist. Tendons are cords of tissue that connect bones to muscles. The tendons in your hand pass through a tunnel, or sheath. A slippery layer of tissue (synovium) lets the tendons move smoothly in the sheath. With de Quervain disease, the sheath swells or thickens, causing friction and pain. The condition is also called de Quervain tendinosis and de Quervain syndrome. It occurs most often in women who are 30-50 years old. What are the causes? The exact cause of de Quervain disease is not known. It may result from:  Overusing your hands, especially with repetitive motions that involve twisting your hand or using a forceful grip.  Pregnancy.  Rheumatoid disease.  What increases the risk? You may have a greater risk for de Quervain disease if you:  Are a middle-aged woman.  Are pregnant.  Have rheumatoid arthritis.  Have diabetes.  Use your hands far more than normal, especially with a tight grip or excessive twisting.  What are the signs or symptoms? Pain on the thumb side of your wrist is the main symptom of de Quervain disease. Other signs and symptoms include:  Pain that gets worse when you grasp something or turn your wrist.  Pain that extends up the forearm.  Cysts in the area of the pain.  Swelling of your wrist and hand.  A sensation of snapping in the wrist.  Trouble moving the thumb and wrist.  How is this diagnosed? Your health care provider may diagnose de Quervain disease based on your signs and symptoms. A physical exam will also be done. A simple test (Finkelstein test) that involves pulling your thumb and wrist to see if this causes pain can help determine whether you have the condition. Sometimes you may need to have an X-ray. How is this treated? Avoiding any activity that causes pain and swelling is the best treatment. Other options include:  Wearing a  splint.  Taking medicine. Anti-inflammatory medicines and corticosteroid injections may reduce inflammation and relieve pain.  Having surgery if other treatments do not work.  Follow these instructions at home:  Using ice can be helpful after doing activities that involve the sore wrist. To apply ice to the injured area: ? Put ice in a plastic bag. ? Place a towel between your skin and the bag. ? Leave the ice on for 20 minutes, 2-3 times a day.  Take medicines only as directed by your health care provider.  Wear your splint as directed. This will allow your hand to rest and heal. Contact a health care provider if:  Your pain medicine does not help.  Your pain gets worse.  You develop new symptoms. This information is not intended to replace advice given to you by your health care provider. Make sure you discuss any questions you have with your health care provider. Document Released: 08/02/2001 Document Revised: 04/14/2016 Document Reviewed: 03/12/2014 Elsevier Interactive Patient Education  2018 [employer]  

## 2017-10-04 NOTE — Progress Notes (Signed)
HPI: Kelly Bruce is a 41 y.o. 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  she presents to [facility], 10/04/17,  for chief complaint of:  Chief Complaint  Patient presents with  . Wrist Pain    LEFT    L wrist pain, ongoing few months but getting worse. Wores when picking up her 35-month old baby. She is using a splint bought OTC which immobilizes the wrist but not the thumb. Tylenol and ice help a bit but condition is worsening despite these interventions.    Past medical, surgical, social and family history reviewed:  Patient Active Problem List   Diagnosis Date Noted  . Dysphagia   . H/O shoulder surgery 08/19/2015  . 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 Irregular bowel habits 08/19/2015  . Esophageal reflux 08/18/2015  . Absolute anemia 08/18/2015  . Obesity 08/18/2015  . Lipid screening 08/18/2015  . Reflux esophagitis 08/18/2015  . Diabetes mellitus screening 08/18/2015  . Medication management 08/18/2015  . Polyp, sigmoid colon 08/18/2015  . Hiatal hernia 08/18/2015    Past Surgical History:  Procedure Laterality Date  . CYST EXCISION Right 1996   wrist  . NM RENAL LASIX ([place] HX)    . PLANTAR FASCIA RELEASE Bilateral [id number]  . SHOULDER ARTHROSCOPY  2011    Social History   Tobacco Use  . Smoking status: Never Smoker  . Smokeless tobacco: Never Used  Substance Use Topics  . Alcohol use: Yes    Alcohol/week: 0.6 oz    Types: 1 Standard drinks or equivalent per week    Family History  Problem Relation Age of Onset  . Depression Mother   . Mental illness Mother   . Bipolar disorder Mother   . Hypertension Mother   . Heart attack Father   . Diabetes Father   . Stroke Maternal Grandmother    . Stroke Paternal Grandmother   . Heart attack Paternal Grandfather      Current medication list and allergy/intolerance information reviewed:    Current Outpatient Medications  Medication Sig Dispense Refill  . Fe Fum-FePoly-FA-Vit C-Vit B3 (FOLIVANE-F) 125-1 MG CAPS TAKE ONE CAPSULE BY MOUTH EVERY DAY 30 capsule 1  . omeprazole (PRILOSEC) 40 MG capsule TAKE ONE CAPSULE BY MOUTH EVERY DAY 30 capsule 2   No current facility-administered medications for this visit.     No Known Allergies    Review of Systems:  Musculoskeletal: +new myalgia/arthralgia  Skin: No  Rash  Neurologic: No  weakness, no numbness    Exam:  BP 128/87   Pulse 69   Wt 256 lb (116.1 kg)   BMI 42.60 kg/m   Constitutional: VS see above. General Appearance: alert, well-developed, well-nourished, NAD  Eyes: Normal lids and conjunctive, non-icteric sclera  Ears, Nose, Mouth, Throat: MMM, Normal external inspection ears/nares/mouth/lips/gums.   Neck: No masses, trachea midline.   Respiratory: Normal respiratory effort.   Musculoskeletal: Gait normal. (+)Finkelstein's test on L, (-) Tinel's on L, normal strength and ROM L wrist and thumb  Neurological: Normal balance/coordination. No tremor.  Motor and sensation intact and symmetric.   Skin: warm, dry, intact. No rash/ulcer.     ASSESSMENT/PLAN:   [name] disease (tenosynovitis) - breastfeeding, minimize meds. Trial voltaren gel. Thumb splint provided, hom eexercise provided. Formal PT referral and/or Sports f/u if needed  Meds ordered this encounter  Medications  . diclofenac sodium (VOLTAREN) 1 % GEL    Sig: Apply 2-4 g 4 (four) times daily topically. To affected joint.    Dispense:  100 g    Refill:  11      Visit summary with medication list and pertinent instructions was printed for patient to review. All questions at time of visit were answered - patient instructed to contact office with any additional concerns. ER/RTC  precautions were reviewed with the patient. Follow-up plan: Return for return visit with sports medicine in 2-4 weeks if no better (Dr. Georgina Snell or Dr. Darene Lamer).     Please note: voice recognition software was used to produce this document, and typos may escape review. Please contact Dr. Sheppard Coil for any needed clarifications.

## 2018-01-03 ENCOUNTER — Other Ambulatory Visit: Payer: Self-pay | Admitting: Gastroenterology

## 2018-01-03 DIAGNOSIS — K219 Gastro-esophageal reflux disease without esophagitis: Secondary | ICD-10-CM

## 2018-06-28 ENCOUNTER — Other Ambulatory Visit: Payer: Self-pay | Admitting: Gastroenterology

## 2018-06-28 DIAGNOSIS — K219 Gastro-esophageal reflux disease without esophagitis: Secondary | ICD-10-CM

## 2018-08-01 ENCOUNTER — Ambulatory Visit (INDEPENDENT_AMBULATORY_CARE_PROVIDER_SITE_OTHER): Payer: PRIVATE HEALTH INSURANCE | Admitting: Osteopathic Medicine

## 2018-08-01 ENCOUNTER — Encounter: Payer: Self-pay | Admitting: Osteopathic Medicine

## 2018-08-01 VITALS — BP 138/58 | HR 92 | Temp 98.1°F | Wt 243.5 lb

## 2018-08-01 DIAGNOSIS — Z0000 Encounter for general adult medical examination without abnormal findings: Secondary | ICD-10-CM

## 2018-08-01 DIAGNOSIS — Z87898 Personal history of other specified conditions: Secondary | ICD-10-CM

## 2018-08-01 DIAGNOSIS — Z8742 Personal history of other diseases of the female genital tract: Secondary | ICD-10-CM

## 2018-08-01 DIAGNOSIS — K219 Gastro-esophageal reflux disease without esophagitis: Secondary | ICD-10-CM

## 2018-08-01 DIAGNOSIS — Z23 Encounter for immunization: Secondary | ICD-10-CM

## 2018-08-01 DIAGNOSIS — Z832 Family history of diseases of the blood and blood-forming organs and certain disorders involving the immune mechanism: Secondary | ICD-10-CM | POA: Diagnosis not present

## 2018-08-01 DIAGNOSIS — D126 Benign neoplasm of colon, unspecified: Secondary | ICD-10-CM

## 2018-08-01 MED ORDER — OMEPRAZOLE 20 MG PO CPDR: 20.0000 mg | DELAYED_RELEASE_CAPSULE | Freq: Every day | ORAL | 3 refills | Status: DC

## 2018-08-01 MED ORDER — RANITIDINE HCL 300 MG PO TABS: 300.0000 mg | ORAL_TABLET | Freq: Every day | ORAL | 3 refills | Status: DC

## 2018-08-01 NOTE — Patient Instructions (Addendum)
General Preventive Care  Most recent routine screening lipids/other labs: ordered today   Tobacco: don't! Alcohol: responsible moderation is ok for most people. Recreational/Illicit Drugs: don't!  Exercise: as tolerated to reduce risk of cardiovascular disease and diabetes  Mental health: if need for mental health care (medicines, counseling, other), or concerns about moods, please let me know!   Sexual health: if need for pregnancy prevention or STD testing, or if concerns about libido/pain, please let me know!   Vaccines  Flu vaccine: recommended every fall (by Halloween!)  Shingles vaccine: Shingrix recommended after age 60  Pneumonia vaccines: Prevnar and Pneumovax recommended after age 64, sooner if diabetes, COPD/asthma, others  Tetanus booster: Tdap recommended every 10 years - you're up to date  Cancer screenings   Colon cancer screening: recommended at age 76, colonoscopy sooner if risk factors   Breast cancer screening: mammogram recommended starting at at age 35  Cervical cancer screening: every 1 to 5 years depending on age and other risk factors.   Infection screenings . HIV: recommended screening at least once age 30-65, more often if risk factors  . Gonorrhea/Chlamydia: screening as needed . Hepatitis C: recommended for anyone born 21-1963  Other . Bone Density Test: recommended for women at age 17, men at age 76, sooner depending on risk factors . Advanced Directive: Living Will and/or Healthcare Power of Attorney recommended for everyone, regardless of age or health . Cholesterol: recommended screening annually . Diabetes: recommended screening annually  . Thyroid and Vitamin D: routine screening not recommended, most insurance will not cover this test

## 2018-08-01 NOTE — Progress Notes (Signed)
HPI: Kelly Bruce is a 42 y.o. 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  she presents to [facility] today, 08/01/18,  for chief complaint of: Annual physical    Patient here for annual physical / wellness exam.  See preventive care reviewed as below.   Follows w/ OBGYN for Pap and Mammo, we do not have most recent results. She has hx ASCUS w/ Neg HPV 10/217 - that's the last I ca nsee in [place] for her.    Additional concerns today include:   Dad recently diagnosed w/ Factor V Leiden deficiency, she'd like testing for this. No Hx VTE.   R eye twitch on occasion, worse w/ stress/lack sleep, no HA/VC.   GERD ok on omeprazole 20 mg, never been on ranitidine     Past medical, surgical, social and family history reviewed:  Patient Active Problem List   Diagnosis Date Noted  . Dysphagia   . H/O shoulder surgery 08/19/2015  . 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 Irregular bowel habits 08/19/2015  . Esophageal reflux 08/18/2015  . Absolute anemia 08/18/2015  . Obesity 08/18/2015  . Lipid screening 08/18/2015  . Reflux esophagitis 08/18/2015  . Diabetes mellitus screening 08/18/2015  . Medication management 08/18/2015  . Polyp, sigmoid colon 08/18/2015  . Hiatal hernia 08/18/2015    Past Surgical History:  Procedure Laterality Date  . CYST EXCISION Right 1996   wrist  . ESOPHAGEAL MANOMETRY N/A 02/22/2016   Procedure: ESOPHAGEAL MANOMETRY (EM);  Surgeon: Mauri Pole, MD;  Location: [facility];  Service: Endoscopy;  Laterality: N/A;  . NM RENAL LASIX ([place] HX)    . [place] IMPEDANCE STUDY N/A 02/22/2016   Procedure: [place] IMPEDANCE STUDY;  Surgeon: Mauri Pole, MD;  Location: [facility];  Service:  Endoscopy;  Laterality: N/A;  . PLANTAR FASCIA RELEASE Bilateral [id number]  . SHOULDER ARTHROSCOPY  2011    Social History   Tobacco Use  . Smoking status: Never Smoker  . Smokeless tobacco: Never Used  Substance Use Topics  . Alcohol use: Yes    Alcohol/week: 1.0 standard drinks    Types: 1 Standard drinks or equivalent per week    Family History  Problem Relation Age of Onset  . Depression Mother   . Mental illness Mother   . Bipolar disorder Mother   . Hypertension Mother   . Heart attack Father   . Diabetes Father   . Stroke Maternal Grandmother   . Stroke Paternal Grandmother   . Heart attack Paternal Grandfather      Current medication list and allergy/intolerance information reviewed:    Current Outpatient Medications  Medication Sig Dispense Refill  . diclofenac sodium (VOLTAREN) 1 % GEL Apply 2-4 g 4 (four) times daily topically. To affected joint. 100 g 11  . Fe Fum-FePoly-FA-Vit C-Vit B3 (FOLIVANE-F) 125-1 MG CAPS TAKE ONE CAPSULE BY MOUTH EVERY DAY 30 capsule 1  . omeprazole (PRILOSEC) 40 MG capsule TAKE ONE CAPSULE BY MOUTH EVERY DAY 90 capsule 1   No current facility-administered medications for this visit.     No Known Allergies    Review of Systems:  Constitutional:  No  fever, no chills, No recent illness, No unintentional weight changes. No significant fatigue.   HEENT: No  headache, no vision change, no hearing change, No sore throat,  No  sinus pressure  Cardiac: No  chest pain, No  pressure, No palpitations, No  Orthopnea  Respiratory:  No  shortness of breath. No  Cough  Gastrointestinal: No  abdominal pain, No  nausea, No  vomiting,  No  blood in stool, No  diarrhea, No  constipation   Musculoskeletal: No new myalgia/arthralgia  Skin: No  Rash, No other wounds/concerning lesions  Genitourinary: No  incontinence, No  abnormal genital bleeding, No abnormal genital discharge  Hem/Onc: No  easy bruising/bleeding, No  abnormal lymph  node  Endocrine: No cold intolerance,  No heat intolerance. No polyuria/polydipsia/polyphagia   Neurologic: No  weakness, No  dizziness, No  slurred speech/focal weakness/facial droop  Psychiatric: No  concerns with depression, No  concerns with anxiety, No sleep problems, No mood problems  Exam:  BP (!) 138/58 (BP Location: Left Arm, Patient Position: Sitting, Cuff Size: Large)   Pulse 92   Temp 98.1 F (36.7 C) (Oral)   Wt 243 lb 8 oz (110.5 kg)   BMI 40.52 kg/m   Constitutional: VS see above. General Appearance: alert, well-developed, well-nourished, NAD  Eyes: Normal lids and conjunctive, non-icteric sclera  Ears, Nose, Mouth, Throat: MMM, Normal external inspection ears/nares/mouth/lips/gums. TM normal bilaterally. Pharynx/tonsils no erythema, no exudate. Nasal mucosa normal.   Neck: No masses, trachea midline. No thyroid enlargement. No tenderness/mass appreciated. No lymphadenopathy  Respiratory: Normal respiratory effort. no wheeze, no rhonchi, no rales  Cardiovascular: S1/S2 normal, no murmur, no rub/gallop auscultated. RRR. No lower extremity edema.   Gastrointestinal: Nontender, no masses. No hepatomegaly, no splenomegaly. No hernia appreciated. Bowel sounds normal. Rectal exam deferred.   Musculoskeletal: Gait normal. No clubbing/cyanosis of digits.   Neurological: Normal balance/coordination. No tremor. No cranial nerve deficit on limited exam. Motor and sensation intact and symmetric. Cerebellar reflexes intact.   Skin: warm, dry, intact. No rash/ulcer. No concerning nevi or subq nodules on limited exam.    Psychiatric: Normal judgment/insight. Normal mood and affect. Oriented x3.      ASSESSMENT/PLAN:   Annual physical exam - Plan: Factor 5 leiden, CBC, COMPLETE METABOLIC PANEL WITH GFR, Lipid panel, TSH  Need for influenza vaccination - Plan: Flu Vaccine QUAD 6+ mos PF IM (Fluarix Quad PF), Factor 5 leiden, CBC, COMPLETE METABOLIC PANEL WITH GFR, Lipid  panel, TSH  Gastroesophageal reflux disease, esophagitis presence not specified - Plan: Factor 5 leiden, CBC, COMPLETE METABOLIC PANEL WITH GFR, Lipid panel, TSH, omeprazole (PRILOSEC) 20 MG capsule, ranitidine (ZANTAC) 300 MG tablet  Family history of factor V Leiden mutation - Plan: Factor 5 leiden, CBC, COMPLETE METABOLIC PANEL WITH GFR, Lipid panel, TSH  History of abnormal cervical Pap smear - following w/ obgyn, pt advised to ask them to send records next time she is there  Adenomatous polyp of colon, unspecified part of colon - following w/ GI    Patient Instructions  General Preventive Care  Most recent routine screening lipids/other labs: ordered today   Tobacco: don't! Alcohol: responsible moderation is ok for most people. Recreational/Illicit Drugs: don't!  Exercise: as tolerated to reduce risk of cardiovascular disease and diabetes  Mental health: if need for mental health care (medicines, counseling, other), or concerns about moods, please let me know!   Sexual health: if need for pregnancy prevention or STD testing, or if concerns about libido/pain, please let me know!   Vaccines  Flu vaccine: recommended every fall (by Halloween!)  Shingles vaccine: Shingrix recommended after age 34  Pneumonia vaccines: Prevnar and Pneumovax recommended after  age 15, sooner if diabetes, COPD/asthma, others  Tetanus booster: Tdap recommended every 10 years - you're up to date  Cancer screenings   Colon cancer screening: recommended at age 73, colonoscopy sooner if risk factors   Breast cancer screening: mammogram recommended starting at at age 36  Cervical cancer screening: every 1 to 5 years depending on age and other risk factors.   Infection screenings . HIV: recommended screening at least once age 47-65, more often if risk factors  . Gonorrhea/Chlamydia: screening as needed . Hepatitis C: recommended for anyone born 97-1963  Other . Bone Density Test: recommended  for women at age 64, men at age 35, sooner depending on risk factors . Advanced Directive: Living Will and/or Healthcare Power of Attorney recommended for everyone, regardless of age or health . Cholesterol: recommended screening annually . Diabetes: recommended screening annually  . Thyroid and Vitamin D: routine screening not recommended, most insurance will not cover this test   Was advised that insurance may not cover F5L testing as par of annual labs.     Visit summary with medication list and pertinent instructions was printed for patient to review. All questions at time of visit were answered - patient instructed to contact office with any additional concerns. ER/RTC precautions were reviewed with the patient.   Follow-up plan: Return in about 1 year (around 08/02/2019) for annual check-up, sooner if needed .    Please note: voice recognition software was used to produce this document, and typos may escape review. Please contact Dr. Sheppard Coil for any needed clarifications.

## 2018-08-15 LAB — CBC
HCT: 37.8 % (ref 35.0–45.0)
Hemoglobin: 12.6 g/dL (ref 11.7–15.5)
MCH: 26.6 pg — ABNORMAL LOW (ref 27.0–33.0)
MCHC: 33.3 g/dL (ref 32.0–36.0)
MCV: 79.9 fL — ABNORMAL LOW (ref 80.0–100.0)
MPV: 9.1 fL (ref 7.5–12.5)
Platelets: 325 10*3/uL (ref 140–400)
RBC: 4.73 10*6/uL (ref 3.80–5.10)
RDW: 12.9 % (ref 11.0–15.0)
WBC: 8.2 10*3/uL (ref 3.8–10.8)

## 2018-08-15 LAB — COMPLETE METABOLIC PANEL WITH GFR
AG Ratio: 1.9 (calc) (ref 1.0–2.5)
ALT: 13 U/L (ref 6–29)
AST: 10 U/L (ref 10–30)
Albumin: 4.3 g/dL (ref 3.6–5.1)
Alkaline phosphatase (APISO): 57 U/L (ref 33–115)
BUN: 7 mg/dL (ref 7–25)
CO2: 25 mmol/L (ref 20–32)
Calcium: 9.4 mg/dL (ref 8.6–10.2)
Chloride: 105 mmol/L (ref 98–110)
Creat: 0.68 mg/dL (ref 0.50–1.10)
GFR, Est African American: 127 mL/min/{1.73_m2} (ref >=60)
GFR, Est Non African American: 109 mL/min/{1.73_m2} (ref >=60)
Globulin: 2.3 g/dL (calc) (ref 1.9–3.7)
Glucose, Bld: 74 mg/dL (ref 65–99)
Potassium: 3.7 mmol/L (ref 3.5–5.3)
Sodium: 141 mmol/L (ref 135–146)
Total Bilirubin: 0.4 mg/dL (ref 0.2–1.2)
Total Protein: 6.6 g/dL (ref 6.1–8.1)

## 2018-08-15 LAB — LIPID PANEL
Cholesterol: 185 mg/dL (ref <200)
HDL: 49 mg/dL — ABNORMAL LOW (ref >50)
LDL Cholesterol (Calc): 114 mg/dL (calc) — ABNORMAL HIGH
Non-HDL Cholesterol (Calc): 136 mg/dL (calc) — ABNORMAL HIGH (ref <130)
Total CHOL/HDL Ratio: 3.8 (calc) (ref <5.0)
Triglycerides: 113 mg/dL (ref <150)

## 2018-08-15 LAB — FACTOR 5 LEIDEN

## 2018-08-15 LAB — TSH: TSH: 1.46 mIU/L

## 2019-07-19 ENCOUNTER — Other Ambulatory Visit: Payer: Self-pay | Admitting: Osteopathic Medicine

## 2019-07-19 DIAGNOSIS — K219 Gastro-esophageal reflux disease without esophagitis: Secondary | ICD-10-CM

## 2019-07-19 NOTE — Telephone Encounter (Signed)
Forwarding medication refill request to PCP for review. 

## 2019-08-05 ENCOUNTER — Encounter: Payer: PRIVATE HEALTH INSURANCE | Admitting: Osteopathic Medicine

## 2019-08-07 ENCOUNTER — Ambulatory Visit (INDEPENDENT_AMBULATORY_CARE_PROVIDER_SITE_OTHER): Payer: BC Managed Care – PPO | Admitting: Osteopathic Medicine

## 2019-08-07 ENCOUNTER — Encounter: Payer: Self-pay | Admitting: Osteopathic Medicine

## 2019-08-07 ENCOUNTER — Other Ambulatory Visit: Payer: Self-pay

## 2019-08-07 VITALS — BP 134/68 | HR 71 | Temp 98.4°F | Wt 242.1 lb

## 2019-08-07 DIAGNOSIS — Z8632 Personal history of gestational diabetes: Secondary | ICD-10-CM | POA: Diagnosis not present

## 2019-08-07 DIAGNOSIS — K219 Gastro-esophageal reflux disease without esophagitis: Secondary | ICD-10-CM

## 2019-08-07 DIAGNOSIS — Z23 Encounter for immunization: Secondary | ICD-10-CM

## 2019-08-07 DIAGNOSIS — D126 Benign neoplasm of colon, unspecified: Secondary | ICD-10-CM | POA: Diagnosis not present

## 2019-08-07 DIAGNOSIS — Z0000 Encounter for general adult medical examination without abnormal findings: Secondary | ICD-10-CM | POA: Diagnosis not present

## 2019-08-07 DIAGNOSIS — Z1239 Encounter for other screening for malignant neoplasm of breast: Secondary | ICD-10-CM

## 2019-08-07 NOTE — Progress Notes (Signed)
HPI: Kelly Bruce is a 43 y.o. 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  she presents to [facility] today, 08/07/19,  for chief complaint of: Annual physical  Patient here for annual physical / wellness exam.  See preventive care reviewed as below.  No complaints or questions today     Past medical, surgical, social and family history reviewed:  Patient Active Problem List   Diagnosis Date Noted  . Dysphagia   . H/O shoulder surgery 08/19/2015  . 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 Irregular bowel habits 08/19/2015  . Esophageal reflux 08/18/2015  . Absolute anemia 08/18/2015  . Obesity 08/18/2015  . Lipid screening 08/18/2015  . Reflux esophagitis 08/18/2015  . Diabetes mellitus screening 08/18/2015  . Medication management 08/18/2015  . Polyp, sigmoid colon 08/18/2015  . Hiatal hernia 08/18/2015    Past Surgical History:  Procedure Laterality Date  . CYST EXCISION Right 1996   wrist  . ESOPHAGEAL MANOMETRY N/A 02/22/2016   Procedure: ESOPHAGEAL MANOMETRY (EM);  Surgeon: Mauri Pole, MD;  [facility];  Service: Endoscopy;  Laterality: N/A;  . NM RENAL LASIX ([place] HX)    . [place] IMPEDANCE STUDY N/A 02/22/2016   Procedure: [place] IMPEDANCE STUDY;  Surgeon: Mauri Pole, MD;  [facility];  Service: Endoscopy;  Laterality: N/A;  . PLANTAR FASCIA RELEASE Bilateral [id number]  . SHOULDER ARTHROSCOPY  2011    Social History   Tobacco Use  . Smoking status: Never Smoker  . Smokeless tobacco: Never Used  Substance Use Topics  . Alcohol use: Yes    Alcohol/week: 1.0 standard drinks    Types: 1 Standard drinks or equivalent per week    Family History  Problem Relation Age of Onset  .  Depression Mother   . Mental illness Mother   . Bipolar disorder Mother   . Hypertension Mother   . Heart attack Father   . Diabetes Father   . Factor V Leiden deficiency Father   . Stroke Maternal Grandmother   . Stroke Paternal Grandmother   . Heart attack Paternal Grandfather      Current medication list and allergy/intolerance information reviewed:    Current Outpatient Medications  Medication Sig Dispense Refill  . omeprazole (PRILOSEC) 20 MG capsule TAKE 1 CAPSULE BY MOUTH EVERY DAY 90 capsule 3  . ranitidine (ZANTAC) 300 MG tablet Take 1 tablet (300 mg total) by mouth at bedtime. (Patient not taking: Reported on 08/07/2019) 90 tablet 3   No current facility-administered medications for this visit.     No Known Allergies    Review of Systems:  Constitutional:  No  fever, no chills, No recent illness, No unintentional weight changes. No significant fatigue.   HEENT: No  headache, no vision change, no hearing change, No sore throat, No  sinus pressure  Cardiac: No  chest pain, No  pressure, No palpitations, No  Orthopnea  Respiratory:  No  shortness of breath. No  Cough  Gastrointestinal: No  abdominal pain, No  nausea, No  vomiting,  No  blood in stool, No  diarrhea, No  constipation   Musculoskeletal: No new myalgia/arthralgia  Skin: No  Rash, No other wounds/concerning lesions  Genitourinary: No  incontinence, No  abnormal genital bleeding, No abnormal genital discharge  Hem/Onc: No  easy bruising/bleeding, No  abnormal lymph node  Endocrine: No cold intolerance,  No heat intolerance. No polyuria/polydipsia/polyphagia   Neurologic: No  weakness, No  dizziness, No  slurred speech/focal weakness/facial droop  Psychiatric: No  concerns with depression, No  concerns with anxiety, No sleep problems, No mood problems  Exam:  BP 134/68 (BP Location: Left Arm, Patient Position: Sitting, Cuff Size: Large)   Pulse 71   Temp 98.4 F (36.9 C) (Oral)   Wt 242 lb 1.6  oz (109.8 kg)   BMI 40.29 kg/m   Constitutional: VS see above. General Appearance: alert, well-developed, well-nourished, NAD  Eyes: Normal lids and conjunctive, non-icteric sclera  Ears, Nose, Mouth, Throat: Mask in place TM normal bilaterally.   Neck: No masses, trachea midline. No thyroid enlargement. No tenderness/mass appreciated. No lymphadenopathy  Respiratory: Normal respiratory effort. no wheeze, no rhonchi, no rales  Cardiovascular: S1/S2 normal, no murmur, no rub/gallop auscultated. RRR. No lower extremity edema.   Gastrointestinal: Nontender, no masses. No hepatomegaly, no splenomegaly. No hernia appreciated. Bowel sounds normal. Rectal exam deferred.   Musculoskeletal: Gait normal. No clubbing/cyanosis of digits.   Neurological: Normal balance/coordination. No tremor. No cranial nerve deficit on limited exam. Motor and sensation intact and symmetric. Cerebellar reflexes intact.   Skin: warm, dry, intact. No rash/ulcer. No concerning nevi or subq nodules on limited exam.    Psychiatric: Normal judgment/insight. Normal mood and affect. Oriented x3.      ASSESSMENT/PLAN: The primary encounter diagnosis was Annual physical exam. Diagnoses of Need for influenza vaccination, Adenomatous polyp of colon, unspecified part of colon, History of gestational diabetes, Gastroesophageal reflux disease, esophagitis presence not specified, and Breast cancer screening were also pertinent to this visit.  . Patient Instructions  General Preventive Care  Most recent routine screening lipids/other labs: ordered!   Everyone should have blood pressure checked once per year.   Tobacco: don't!   Alcohol: responsible moderation is ok for most adults - if you have concerns about your alcohol intake, please talk to me!   Exercise: as tolerated to reduce risk of cardiovascular disease and diabetes. Strength training will also prevent osteoporosis.   Mental health: if need for mental  health care (medicines, counseling, other), or concerns about moods, please let me know!   Sexual health: if need for STD testing, or if concerns with libido/pain problems, please let me know! If you need to discuss your birth control options, please let me know!   Advanced Directive: Living Will and/or Healthcare Power of Attorney recommended for all adults, regardless of age or health.  Vaccines  Flu vaccine: recommended for almost everyone, every fall.   Shingles vaccine: Shingrix recommended after age 27.   Pneumonia vaccines: Prevnar and Pneumovax recommended after age 71, or sooner if certain medical conditions.  Tetanus booster: Tdap recommended every 10 years. You're good until 2028.  Cancer screenings   Colon cancer screening: see below for info for GI   Breast cancer screening: mammogram recommended at age 30 every other year at least, and annually after age 57.   Cervical cancer screening: Pap due!   Lung cancer screening: not needed for non-smokers Infection screenings . HIV: recommended screening at least once age 33-65, more often as needed . Gonorrhea/Chlamydia: screening as needed . Hepatitis C: recommended for anyone born 07-1964 (not needed for you) . TB: certain at-risk patients Other . Bone Density Test: recommended for women at age 54      Visit summary with medication list and pertinent instructions was printed for patient  to review. All questions at time of visit were answered - patient instructed to contact office with any additional concerns. ER/RTC precautions were reviewed with the patient.   Follow-up plan: Return for Pap in the next few weeks. Lab visit fatins that day. .    Please note: voice recognition software was used to produce this document, and typos may escape review. Please contact Dr. Sheppard Coil for any needed clarifications.

## 2019-08-07 NOTE — Patient Instructions (Addendum)
General Preventive Care  Most recent routine screening lipids/other labs: ordered!   Everyone should have blood pressure checked once per year.   Tobacco: don't!   Alcohol: responsible moderation is ok for most adults - if you have concerns about your alcohol intake, please talk to me!   Exercise: as tolerated to reduce risk of cardiovascular disease and diabetes. Strength training will also prevent osteoporosis.   Mental health: if need for mental health care (medicines, counseling, other), or concerns about moods, please let me know!   Sexual health: if need for STD testing, or if concerns with libido/pain problems, please let me know! If you need to discuss your birth control options, please let me know!   Advanced Directive: Living Will and/or Healthcare Power of Attorney recommended for all adults, regardless of age or health.  Vaccines  Flu vaccine: recommended for almost everyone, every fall.   Shingles vaccine: Shingrix recommended after age 89.   Pneumonia vaccines: Prevnar and Pneumovax recommended after age 19, or sooner if certain medical conditions.  Tetanus booster: Tdap recommended every 10 years. You're good until 2028.  Cancer screenings   Colon cancer screening: see below for info for GI   Breast cancer screening: mammogram recommended at age 23 every other year at least, and annually after age 8.   Cervical cancer screening: Pap due!   Lung cancer screening: not needed for non-smokers Infection screenings . HIV: recommended screening at least once age 94-65, more often as needed . Gonorrhea/Chlamydia: screening as needed . Hepatitis C: recommended for anyone born 45-1963 (not needed for you) . TB: certain at-risk patients Other . Bone Density Test: recommended for women at age 27

## 2019-08-29 ENCOUNTER — Other Ambulatory Visit (HOSPITAL_COMMUNITY)
Admission: RE | Admit: 2019-08-29 | Discharge: 2019-08-29 | Disposition: A | Discharge status: Home / Self Care | Payer: BC Managed Care – PPO | Source: Ambulatory Visit | Attending: Osteopathic Medicine | Admitting: Osteopathic Medicine

## 2019-08-29 ENCOUNTER — Other Ambulatory Visit: Payer: Self-pay

## 2019-08-29 ENCOUNTER — Encounter: Payer: Self-pay | Admitting: Osteopathic Medicine

## 2019-08-29 ENCOUNTER — Ambulatory Visit (INDEPENDENT_AMBULATORY_CARE_PROVIDER_SITE_OTHER): Payer: BC Managed Care – PPO | Admitting: Osteopathic Medicine

## 2019-08-29 VITALS — BP 114/73 | HR 58 | Temp 98.9°F | Wt 241.4 lb

## 2019-08-29 DIAGNOSIS — Z124 Encounter for screening for malignant neoplasm of cervix: Secondary | ICD-10-CM | POA: Insufficient documentation

## 2019-08-29 NOTE — Progress Notes (Signed)
HPI: Kelly Bruce is a 43 y.o. 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  she presents to [facility] today, 08/29/19,  for chief complaint of:  Pap   Here today just for pelvic/pap  No other complaints or questions Has gotten her blood drawn today    At today's visit 08/29/19 ... PMH, PSH, FH reviewed and updated as needed.  Current medication list and allergy/intolerance hx reviewed and updated as needed. (See remainder of HPI, ROS, Phys Exam below)   No results found.  No results found for this or any previous visit (from the past 72 hour(s)).        ASSESSMENT/PLAN: The encounter diagnosis was Cervical cancer screening.       Follow-up plan: Return in about 1 year (around 08/28/2020) for [place] (call week prior to visit for lab orders), sooner if needed.(pending labs results and Pap results)                                                  ################################################# ################################################# ################################################# #################################################    Current Meds  Medication Sig  . omeprazole (PRILOSEC) 20 MG capsule TAKE 1 CAPSULE BY MOUTH EVERY DAY  . ranitidine (ZANTAC) 300 MG tablet Take 1 tablet (300 mg total) by mouth at bedtime.    No Known Allergies     Review of Systems:  Constitutional: No recent illness   Exam:  BP 114/73 (BP Location: Left Arm, Patient Position: Sitting, Cuff Size: Normal)   Pulse (!) 58   Temp 98.9 F (37.2 C) (Oral)   Wt 241 lb 6.4 oz (109.5 kg)   BMI 40.17 kg/m   Constitutional: VS see above. General Appearance: alert,  well-developed, well-nourished, NAD  Abdominal: non-tender, non-distended  Neurological: Normal balance/coordination. No tremor.  Skin: warm, dry, intact.   Psychiatric: Normal judgment/insight. Normal mood and affect. Oriented x3.   GYN: No lesions/ulcers to external genitalia, normal urethra, normal vaginal mucosa, physiologic discharge, cervix mildly friable, small cyst at about 11:00, uterus not enlarged or tender, adnexa no masses and nontender  BREAST: No rashes/skin changes, normal fibrous breast tissue, no masses or tenderness, normal nipple without discharge, normal axilla      Visit summary with medication list and pertinent instructions was printed for patient to review, patient was advised to alert [place] if any updates are needed. All questions at time of visit were answered - patient instructed to contact office with any additional concerns. ER/RTC precautions were reviewed with the patient and understanding verbalized.   Note: Total time spent 10 minutes, greater than 50% of the visit was spent face-to-face counseling and coordinating care for the following: The encounter diagnosis was Cervical cancer screening..  Please note: voice recognition software was used to produce this document, and typos may escape review. Please contact Dr. Sheppard Coil for any needed clarifications.    Follow up plan: Return in about 1 year (around 08/28/2020) for [place] (call week prior to visit for lab orders), sooner if needed.

## 2019-08-30 LAB — COMPLETE METABOLIC PANEL WITH GFR
AG Ratio: 1.4 (calc) (ref 1.0–2.5)
ALT: 8 U/L (ref 6–29)
AST: 11 U/L (ref 10–30)
Albumin: 4.3 g/dL (ref 3.6–5.1)
Alkaline phosphatase (APISO): 56 U/L (ref 31–125)
BUN: 13 mg/dL (ref 7–25)
CO2: 24 mmol/L (ref 20–32)
Calcium: 9.6 mg/dL (ref 8.6–10.2)
Chloride: 105 mmol/L (ref 98–110)
Creat: 0.75 mg/dL (ref 0.50–1.10)
GFR, Est African American: 115 mL/min/{1.73_m2} (ref >=60)
GFR, Est Non African American: 99 mL/min/{1.73_m2} (ref >=60)
Globulin: 3 g/dL (calc) (ref 1.9–3.7)
Glucose, Bld: 82 mg/dL (ref 65–99)
Potassium: 4.1 mmol/L (ref 3.5–5.3)
Sodium: 139 mmol/L (ref 135–146)
Total Bilirubin: 0.4 mg/dL (ref 0.2–1.2)
Total Protein: 7.3 g/dL (ref 6.1–8.1)

## 2019-08-30 LAB — HEMOGLOBIN A1C
Hgb A1c MFr Bld: 5.7 % of total Hgb — ABNORMAL HIGH (ref <5.7)
Mean Plasma Glucose: 117 (calc)
eAG (mmol/L): 6.5 (calc)

## 2019-08-30 LAB — CBC
HCT: 36.5 % (ref 35.0–45.0)
Hemoglobin: 11.3 g/dL — ABNORMAL LOW (ref 11.7–15.5)
MCH: 23.1 pg — ABNORMAL LOW (ref 27.0–33.0)
MCHC: 31 g/dL — ABNORMAL LOW (ref 32.0–36.0)
MCV: 74.6 fL — ABNORMAL LOW (ref 80.0–100.0)
MPV: 9.2 fL (ref 7.5–12.5)
Platelets: 331 10*3/uL (ref 140–400)
RBC: 4.89 10*6/uL (ref 3.80–5.10)
RDW: 14.5 % (ref 11.0–15.0)
WBC: 7.5 10*3/uL (ref 3.8–10.8)

## 2019-08-30 LAB — LIPID PANEL
Cholesterol: 209 mg/dL — ABNORMAL HIGH (ref <200)
HDL: 55 mg/dL (ref >=50)
LDL Cholesterol (Calc): 134 mg/dL (calc) — ABNORMAL HIGH
Non-HDL Cholesterol (Calc): 154 mg/dL (calc) — ABNORMAL HIGH (ref <130)
Total CHOL/HDL Ratio: 3.8 (calc) (ref <5.0)
Triglycerides: 97 mg/dL (ref <150)

## 2019-08-30 LAB — TSH: TSH: 1.59 mIU/L

## 2019-09-05 ENCOUNTER — Ambulatory Visit (INDEPENDENT_AMBULATORY_CARE_PROVIDER_SITE_OTHER): Payer: BC Managed Care – PPO

## 2019-09-05 ENCOUNTER — Other Ambulatory Visit: Payer: Self-pay

## 2019-09-05 DIAGNOSIS — Z1239 Encounter for other screening for malignant neoplasm of breast: Secondary | ICD-10-CM | POA: Diagnosis not present

## 2019-09-05 DIAGNOSIS — Z0000 Encounter for general adult medical examination without abnormal findings: Secondary | ICD-10-CM

## 2019-09-09 ENCOUNTER — Other Ambulatory Visit: Payer: Self-pay | Admitting: Osteopathic Medicine

## 2019-09-09 DIAGNOSIS — R928 Other abnormal and inconclusive findings on diagnostic imaging of breast: Secondary | ICD-10-CM

## 2019-09-10 LAB — CYTOLOGY - PAP
Comment: NEGATIVE
Diagnosis: NEGATIVE
High risk HPV: NEGATIVE

## 2019-09-12 ENCOUNTER — Ambulatory Visit: Payer: BC Managed Care – PPO

## 2019-09-12 ENCOUNTER — Ambulatory Visit
Admission: RE | Admit: 2019-09-12 | Discharge: 2019-09-12 | Disposition: A | Discharge status: Home / Self Care | Payer: BC Managed Care – PPO | Source: Ambulatory Visit | Attending: Osteopathic Medicine | Admitting: Osteopathic Medicine

## 2019-09-12 ENCOUNTER — Other Ambulatory Visit: Payer: Self-pay

## 2019-09-12 DIAGNOSIS — R928 Other abnormal and inconclusive findings on diagnostic imaging of breast: Secondary | ICD-10-CM

## 2020-02-07 IMAGING — MG DIGITAL SCREENING BILAT W/ TOMO W/ CAD
6 of 10 series · 6 of 30 positions shown · non-contrast
Comparison: None

CLINICAL DATA: Screening.

EXAM:
DIGITAL SCREENING BILATERAL MAMMOGRAM WITH TOMO AND CAD

[L CC synth-2D]
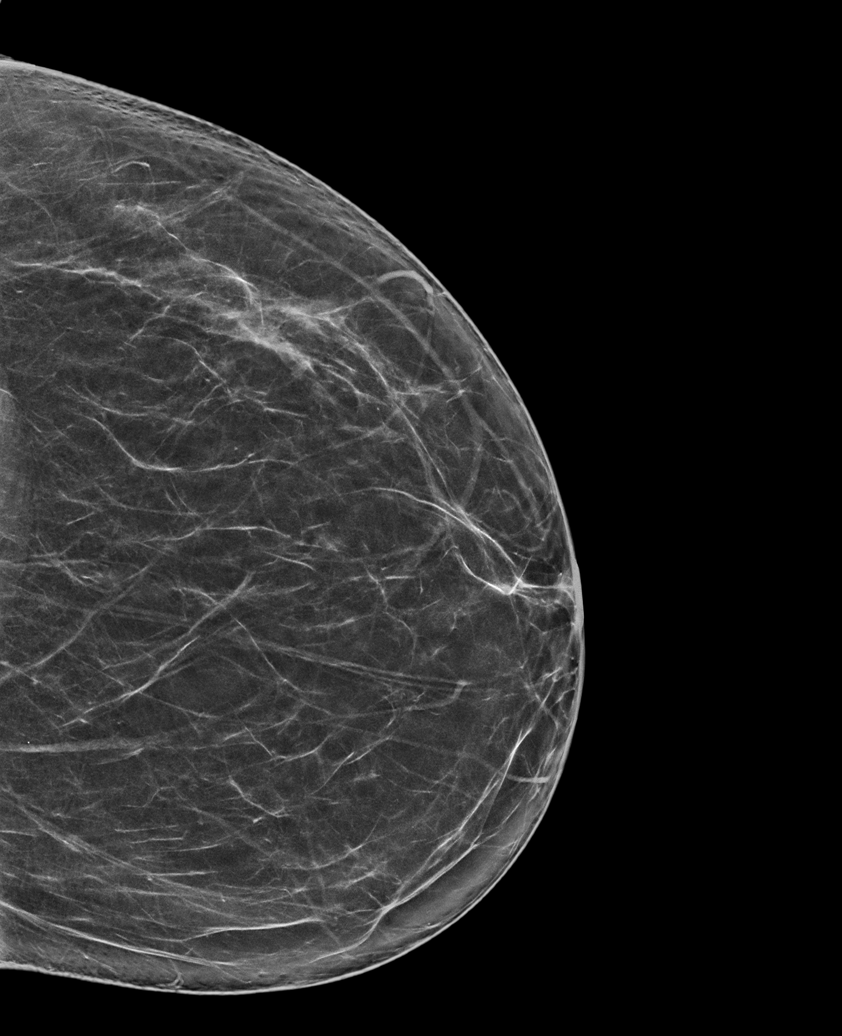

[L MLO synth-2D]
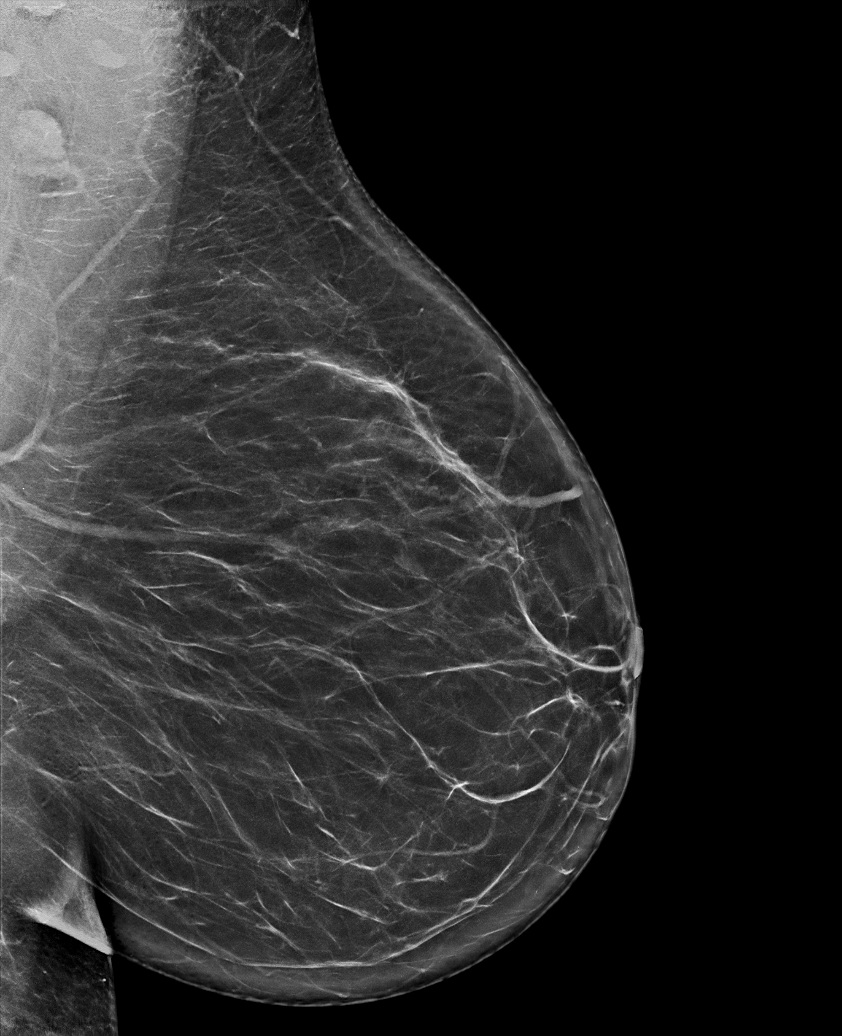

[R MLO synth-2D]
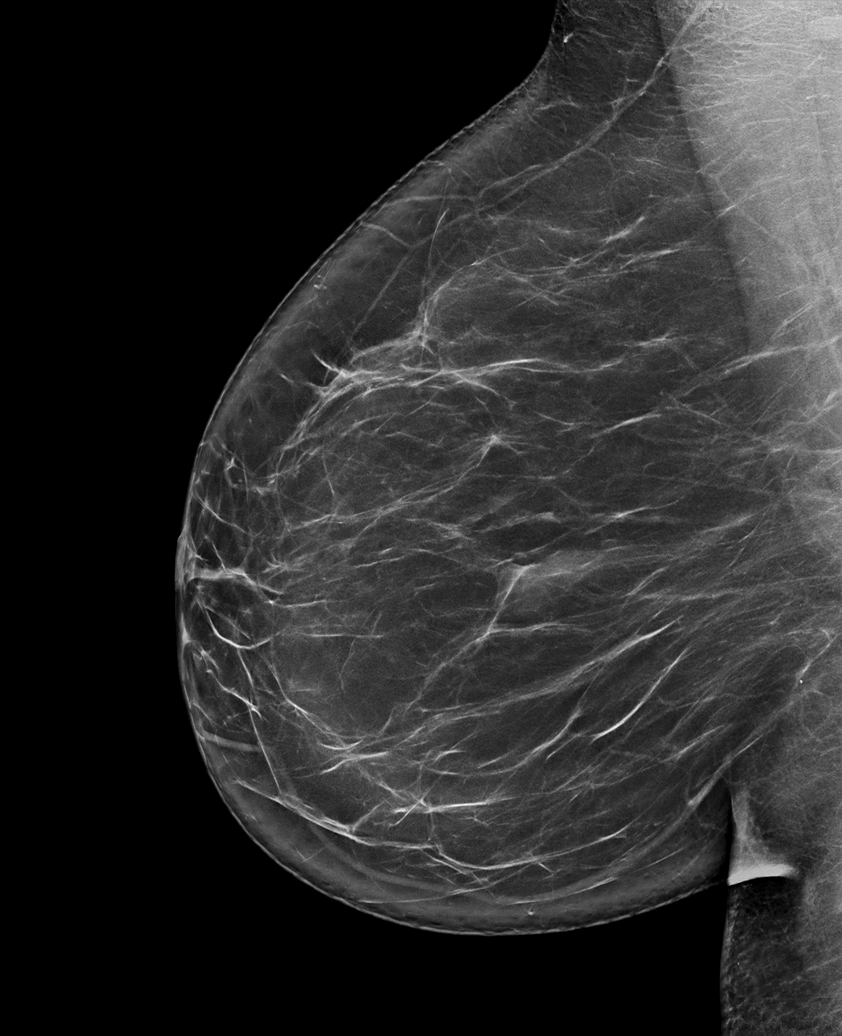

[R XCCL synth-2D]
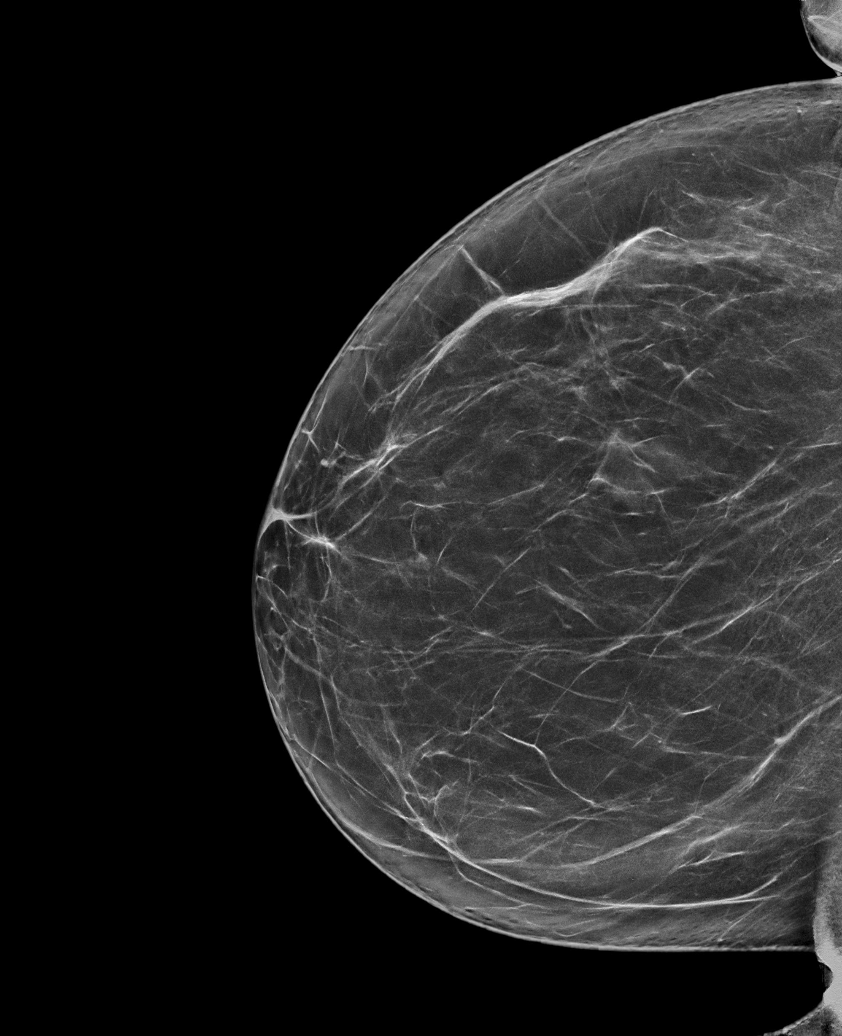

[R CC synth-2D]
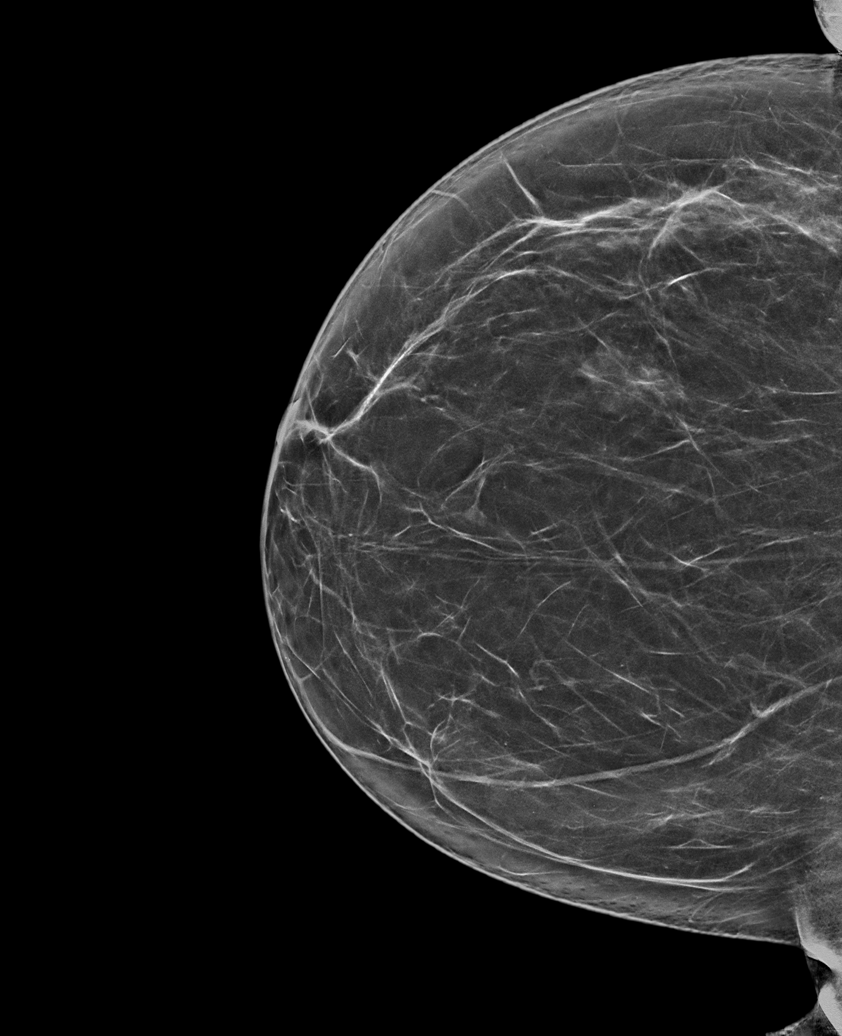

[R CC tomo · tomo slice 40/79.0]
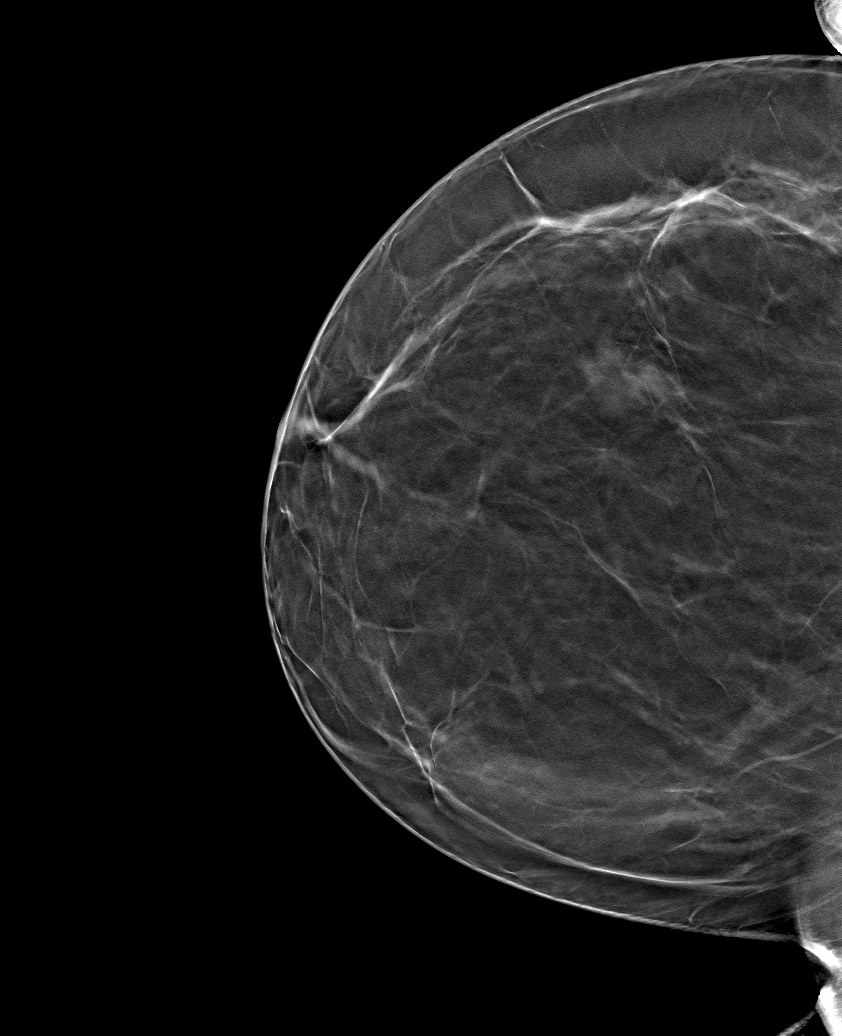

[6 of 30 positions shown; findings below may reference images not displayed]

ACR Breast Density Category b: There are scattered areas of
fibroglandular density.
FINDINGS: In the right breast, a possible asymmetry warrants further
evaluation. In the left breast, no findings suspicious for
malignancy. Images were processed with CAD.
IMPRESSION: Further evaluation is suggested for possible asymmetry in the right
breast.

RECOMMENDATION:
Diagnostic mammogram and possibly ultrasound of the right breast.
(Code:8L-[id number])

The patient will be contacted regarding the findings, and additional
imaging will be scheduled.

BI-RADS CATEGORY  0: Incomplete. Need additional imaging evaluation
and/or prior mammograms for comparison.

## 2020-02-11 ENCOUNTER — Ambulatory Visit (INDEPENDENT_AMBULATORY_CARE_PROVIDER_SITE_OTHER): Payer: BC Managed Care – PPO | Admitting: Family Medicine

## 2020-02-11 ENCOUNTER — Encounter (INDEPENDENT_AMBULATORY_CARE_PROVIDER_SITE_OTHER): Payer: Self-pay | Admitting: Family Medicine

## 2020-02-11 ENCOUNTER — Other Ambulatory Visit: Payer: Self-pay

## 2020-02-11 VITALS — BP 121/79 | HR 72 | Temp 98.5°F | Ht 64.0 in | Wt 242.0 lb

## 2020-02-11 DIAGNOSIS — R7303 Prediabetes: Secondary | ICD-10-CM | POA: Diagnosis not present

## 2020-02-11 DIAGNOSIS — Z9189 Other specified personal risk factors, not elsewhere classified: Secondary | ICD-10-CM | POA: Diagnosis not present

## 2020-02-11 DIAGNOSIS — F3289 Other specified depressive episodes: Secondary | ICD-10-CM | POA: Diagnosis not present

## 2020-02-11 DIAGNOSIS — M79671 Pain in right foot: Secondary | ICD-10-CM

## 2020-02-11 DIAGNOSIS — R0602 Shortness of breath: Secondary | ICD-10-CM | POA: Diagnosis not present

## 2020-02-11 DIAGNOSIS — D509 Iron deficiency anemia, unspecified: Secondary | ICD-10-CM

## 2020-02-11 DIAGNOSIS — R5383 Other fatigue: Secondary | ICD-10-CM | POA: Diagnosis not present

## 2020-02-11 DIAGNOSIS — Z6841 Body Mass Index (BMI) 40.0 and over, adult: Secondary | ICD-10-CM

## 2020-02-11 DIAGNOSIS — M79672 Pain in left foot: Secondary | ICD-10-CM

## 2020-02-11 DIAGNOSIS — Z0289 Encounter for other administrative examinations: Secondary | ICD-10-CM

## 2020-02-11 DIAGNOSIS — E7849 Other hyperlipidemia: Secondary | ICD-10-CM

## 2020-02-11 NOTE — Progress Notes (Signed)
Chief Complaint:   OBESITY Kelly Bruce (MR# [id number]) is a 44 y.o. female who presents for evaluation and treatment of obesity and related comorbidities. Current BMI is Body mass index is 41.54 kg/m. Kelly Bruce has been struggling with her weight for many years and has been unsuccessful in either losing weight, maintaining weight loss, or reaching her healthy weight goal.  Kelly Bruce is currently in the action stage of change and ready to dedicate time achieving and maintaining a healthier weight. Kelly Bruce is interested in becoming our patient and working on intensive lifestyle modifications including (but not limited to) diet and exercise for weight loss.  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  She has a 22-year-old son.  Her elderly mother lives with her and has poor habits.  Kelly Bruce's habits were reviewed today and are as follows: Her family eats meals together, she thinks her family will eat healthier with her, her desired weight loss is 100 pounds, she started gaining weight after 2011, her heaviest weight ever was 250 pounds, she craves breakfast foods, ice cream, and coffee, she is frequently drinking liquids with calories, she frequently makes poor food choices, she frequently eats larger portions than normal and she struggles with emotional eating.  Depression Screen Kelly Bruce's Food and Mood (modified PHQ-9) score was 15.  Depression screen PHQ 2/9 02/11/2020  Decreased Interest 1  Down, Depressed, Hopeless 2  PHQ - 2 Score 3  Altered sleeping 1  Tired, decreased energy 2  Change in appetite 3  Feeling bad or failure about yourself  1  Trouble concentrating 2  Moving slowly or fidgety/restless 3  Suicidal thoughts 0  PHQ-9 Score 15  Difficult doing work/chores Somewhat difficult   Subjective:   1. Other fatigue Kelly Bruce denies daytime somnolence and reports waking up still tired. Patent has a history of symptoms of morning fatigue. Kelly Bruce  generally gets 6 hours of sleep per night, and states that she has poor sleep quality. Snoring is not present. Apneic episodes are not present. Epworth Sleepiness Score is 4.  2. SOB (shortness of breath) on exertion Kelly Bruce notes increasing shortness of breath with exercising and seems to be worsening over time with weight gain. She notes getting out of breath sooner with activity than she used to. This has not gotten worse recently. Kelly Bruce denies shortness of breath at rest or orthopnea.  3. Microcytic anemia Kelly Bruce is not a vegetarian.  She does not have a history of weight loss surgery.   CBC Latest Ref Rng & Units 08/29/2019 08/09/2018 08/18/2015  WBC 3.8 - 10.8 Thousand/uL 7.5 8.2 7.7  Hemoglobin 11.7 - 15.5 g/dL 11.3(L) 12.6 12.3  Hematocrit 35.0 - 45.0 % 36.5 37.8 37.7  Platelets 140 - 400 Thousand/uL 331 325 291   Lab Results  Component Value Date   IRON 49 02/24/2015   4. Prediabetes Kelly Bruce has a diagnosis of prediabetes based on her elevated HgA1c and was informed this puts her at greater risk of developing diabetes. She continues to work on diet and exercise to decrease her risk of diabetes. She denies nausea or hypoglycemia.  Lab Results  Component Value Date   HGBA1C 5.7 (H) 08/29/2019   5. Other hyperlipidemia Kelly Bruce has hyperlipidemia and has been trying to improve her cholesterol levels with intensive lifestyle modification including a low saturated fat diet, exercise and weight loss. She denies any chest pain, claudication or myalgias.  Lab Results  Component Value Date   ALT 8 08/29/2019  AST 11 08/29/2019   ALKPHOS 57 08/18/2015   BILITOT 0.4 08/29/2019   Lab Results  Component Value Date   CHOL 209 (H) 08/29/2019   HDL 55 08/29/2019   LDLCALC 134 (H) 08/29/2019   TRIG 97 08/29/2019   CHOLHDL 3.8 08/29/2019   6. Pain in both feet Kelly Bruce has a history of a stress fracture in the left foot and was in a walking boot for 1 month.  She also has history of  plantar fasciitis and release.    7. Other depression, with emotional eating Kelly Bruce is struggling with emotional eating and using food for comfort to the extent that it is negatively impacting her health. She has been working on behavior modification techniques to help reduce her emotional eating and has been unsuccessful. She shows no sign of suicidal or homicidal ideations.  Kelly Bruce eats for comfort and reward.  She tends to overeat.  PHQ-9 is 15.  Assessment/Plan:   1. Other fatigue Kelly Bruce does feel that her weight is causing her energy to be lower than it should be. Fatigue may be related to obesity, depression or many other causes. Labs will be ordered, and in the meanwhile, Kelly Bruce will focus on self care including making healthy food choices, increasing physical activity and focusing on stress reduction.  Orders - EKG 12-Lead - VITAMIN D 25 Hydroxy (Vit-D Deficiency, Fractures) - TSH - T4, free - T3  2. SOB (shortness of breath) on exertion Kelly Bruce does feel that she gets out of breath more easily that she used to when she exercises. Kelly Bruce's shortness of breath appears to be obesity related and exercise induced. She has agreed to work on weight loss and gradually increase exercise to treat her exercise induced shortness of breath. Will continue to monitor closely.  3. Microcytic anemia Orders and follow up as documented in patient record.  Counseling . Iron is essential for our bodies to make red blood cells.  Reasons that someone may be deficient include: an iron-deficient diet (more likely in those following vegan or vegetarian diets), women with heavy menses, patients with GI disorders or poor absorption, patients that have had bariatric surgery, frequent blood donors, patients with cancer, and patients with heart disease.   Kelly Bruce foods include dark leafy greens, red and white meats, eggs, seafood, and beans.   . Certain foods and drinks prevent your body from absorbing  iron properly. Avoid eating these foods in the same meal as iron-rich foods or with iron supplements. These foods include: coffee, black tea, and red wine; milk, dairy products, and foods that are high in calcium; beans and soybeans; whole grains.  . Constipation can be a side effect of iron supplementation. Increased water and fiber intake are helpful. Water goal: > 2 liters/day. Fiber goal: > 25 grams/day.  Orders - CBC with Differential/Platelet - Anemia panel  4. Prediabetes Kelly Bruce will continue to work on weight loss, exercise, and decreasing simple carbohydrates to help decrease the risk of diabetes.   Orders - Comprehensive metabolic panel - Hemoglobin A1c - Insulin, random  5. Other hyperlipidemia Cardiovascular risk and specific lipid/LDL goals reviewed.  We discussed several lifestyle modifications today and Kelly Bruce will continue to work on diet, exercise and weight loss efforts. Orders and follow up as documented in patient record.   Counseling Intensive lifestyle modifications are the first line treatment for this issue. . Dietary changes: Increase soluble fiber. Decrease simple carbohydrates. . Exercise changes: Moderate to vigorous-intensity aerobic activity 150 minutes per week if  tolerated. . Lipid-lowering medications: see documented in medical record.  Orders - Lipid panel  6. Pain in both feet, left greater than right Will follow because mobility and pain control are important for weight management.  Orders - Ambulatory referral to Sports Medicine  7. Other depression, with emotional eating Behavior modification techniques were discussed today to help Kelly Bruce deal with her emotional/non-hunger eating behaviors.  Orders and follow up as documented in patient record.   8. At risk for diabetes mellitus Kelly Bruce was given approximately 15 minutes of diabetes education and counseling today. We discussed intensive lifestyle modifications today with an emphasis on  weight loss as well as increasing exercise and decreasing simple carbohydrates in her diet. We also reviewed medication options with an emphasis on risk versus benefit of those discussed.   Repetitive spaced learning was employed today to elicit superior memory formation and behavioral change.  9. Class 3 severe obesity with serious comorbidity and body mass index (BMI) of 40.0 to 44.9 in adult, unspecified obesity type (HCC) Kelly Bruce is currently in the action stage of change and her goal is to continue with weight loss efforts. I recommend Kelly Bruce begin the structured treatment plan as follows:  She has agreed to the Category 1 Plan.  Exercise goals: No exercise has been prescribed at this time.   Behavioral modification strategies: increasing lean protein intake, decreasing simple carbohydrates, increasing vegetables, increasing water intake and decreasing liquid calories.  She was informed of the importance of frequent follow-up visits to maximize her success with intensive lifestyle modifications for her multiple health conditions. She was informed we would discuss her lab results at her next visit unless there is a critical issue that needs to be addressed sooner. Kelly Bruce agreed to keep her next visit at the agreed upon time to discuss these results.  Objective:   Blood pressure 121/79, pulse 72, temperature 98.5 F (36.9 C), temperature source Oral, height 5\' 4"  (1.626 m), weight 242 lb (109.8 kg), last menstrual period 02/08/2020, SpO2 97 %. Body mass index is 41.54 kg/m.  EKG: Normal sinus rhythm, rate 64 bpm.  Indirect Calorimeter completed today shows a VO2 of 216 and a REE of 1503.  Her calculated basal metabolic rate is [id number] thus her basal metabolic rate is worse than expected.  General: Cooperative, alert, well developed, in no acute distress. HEENT: Conjunctivae and lids unremarkable. Cardiovascular: Regular rhythm.  Lungs: Normal work of breathing. Neurologic: No focal  deficits.   Lab Results  Component Value Date   CREATININE 0.75 08/29/2019   BUN 13 08/29/2019   NA 139 08/29/2019   K 4.1 08/29/2019   CL 105 08/29/2019   CO2 24 08/29/2019   Lab Results  Component Value Date   ALT 8 08/29/2019   AST 11 08/29/2019   ALKPHOS 57 08/18/2015   BILITOT 0.4 08/29/2019   Lab Results  Component Value Date   HGBA1C 5.7 (H) 08/29/2019   Lab Results  Component Value Date   TSH 1.59 08/29/2019   Lab Results  Component Value Date   CHOL 209 (H) 08/29/2019   HDL 55 08/29/2019   LDLCALC 134 (H) 08/29/2019   TRIG 97 08/29/2019   CHOLHDL 3.8 08/29/2019   Lab Results  Component Value Date   WBC 7.5 08/29/2019   HGB 11.3 (L) 08/29/2019   HCT 36.5 08/29/2019   MCV 74.6 (L) 08/29/2019   PLT 331 08/29/2019   Lab Results  Component Value Date   IRON 49 02/24/2015   Attestation Statements:  This is the patient's first visit at [facility]. The patient's NEW PATIENT PACKET was reviewed at length. Included in the packet: current and past health history, medications, allergies, ROS, gynecologic history (women only), surgical history, family history, social history, weight history, weight loss surgery history (for those that have had weight loss surgery), nutritional evaluation, mood and food questionnaire, PHQ9, Epworth questionnaire, sleep habits questionnaire, patient life and health improvement goals questionnaire. These will all be scanned into the patient's chart under media.   During the visit, I independently reviewed the patient's EKG, bioimpedance scale results, and indirect calorimeter results. I used this information to tailor a meal plan for the patient that will help her to lose weight and will improve her obesity-related conditions going forward. I performed a medically necessary appropriate examination and/or evaluation. I discussed the assessment and treatment plan with the patient. The patient was provided an opportunity to  ask questions and all were answered. The patient agreed with the plan and demonstrated an understanding of the instructions. Labs were ordered at this visit and will be reviewed at the next visit unless more critical results need to be addressed immediately. Clinical information was updated and documented in the EMR.   I, Water quality scientist, CMA, am acting as Location manager for [employer], DO.  I have reviewed the above documentation for accuracy and completeness, and I agree with the above. Briscoe Deutscher, DO

## 2020-02-12 LAB — LIPID PANEL
Chol/HDL Ratio: 3.9 ratio (ref 0.0–4.4)
Cholesterol, Total: 211 mg/dL — ABNORMAL HIGH (ref 100–199)
HDL: 54 mg/dL (ref >39)
LDL Chol Calc (NIH): 137 mg/dL — ABNORMAL HIGH (ref 0–99)
Triglycerides: 111 mg/dL (ref 0–149)
VLDL Cholesterol Cal: 20 mg/dL (ref 5–40)

## 2020-02-12 LAB — COMPREHENSIVE METABOLIC PANEL
ALT: 10 IU/L (ref 0–32)
AST: 14 IU/L (ref 0–40)
Albumin/Globulin Ratio: 1.6 (ref 1.2–2.2)
Albumin: 4.4 g/dL (ref 3.8–4.8)
Alkaline Phosphatase: 70 IU/L (ref 39–117)
BUN/Creatinine Ratio: 20 (ref 9–23)
BUN: 13 mg/dL (ref 6–24)
Bilirubin Total: 0.3 mg/dL (ref 0.0–1.2)
CO2: 21 mmol/L (ref 20–29)
Calcium: 9.3 mg/dL (ref 8.7–10.2)
Chloride: 103 mmol/L (ref 96–106)
Creatinine, Ser: 0.65 mg/dL (ref 0.57–1.00)
GFR calc Af Amer: 128 mL/min/{1.73_m2} (ref >59)
GFR calc non Af Amer: 111 mL/min/{1.73_m2} (ref >59)
Globulin, Total: 2.8 g/dL (ref 1.5–4.5)
Glucose: 78 mg/dL (ref 65–99)
Potassium: 4.2 mmol/L (ref 3.5–5.2)
Sodium: 141 mmol/L (ref 134–144)
Total Protein: 7.2 g/dL (ref 6.0–8.5)

## 2020-02-12 LAB — CBC WITH DIFFERENTIAL/PLATELET
Basophils Absolute: 0 10*3/uL (ref 0.0–0.2)
Basos: 1 %
EOS (ABSOLUTE): 0.1 10*3/uL (ref 0.0–0.4)
Eos: 1 %
Hemoglobin: 14 g/dL (ref 11.1–15.9)
Immature Grans (Abs): 0 10*3/uL (ref 0.0–0.1)
Immature Granulocytes: 0 %
Lymphocytes Absolute: 2.4 10*3/uL (ref 0.7–3.1)
Lymphs: 35 %
MCH: 25.5 pg — ABNORMAL LOW (ref 26.6–33.0)
MCHC: 32.3 g/dL (ref 31.5–35.7)
MCV: 79 fL (ref 79–97)
Monocytes Absolute: 0.4 10*3/uL (ref 0.1–0.9)
Monocytes: 6 %
Neutrophils Absolute: 3.8 10*3/uL (ref 1.4–7.0)
Neutrophils: 57 %
Platelets: 296 10*3/uL (ref 150–450)
RBC: 5.5 x10E6/uL — ABNORMAL HIGH (ref 3.77–5.28)
RDW: 15.2 % (ref 11.7–15.4)
WBC: 6.7 10*3/uL (ref 3.4–10.8)

## 2020-02-12 LAB — ANEMIA PANEL
Ferritin: 26 ng/mL (ref 15–150)
Folate, Hemolysate: 432 ng/mL
Folate, RBC: 998 ng/mL (ref >498)
Hematocrit: 43.3 % (ref 34.0–46.6)
Iron Saturation: 14 % — ABNORMAL LOW (ref 15–55)
Iron: 59 ug/dL (ref 27–159)
Retic Ct Pct: 1.1 % (ref 0.6–2.6)
Total Iron Binding Capacity: 428 ug/dL (ref 250–450)
UIBC: 369 ug/dL (ref 131–425)
Vitamin B-12: 607 pg/mL (ref 232–1245)

## 2020-02-12 LAB — HEMOGLOBIN A1C
Est. average glucose Bld gHb Est-mCnc: 117 mg/dL
Hgb A1c MFr Bld: 5.7 % — ABNORMAL HIGH (ref 4.8–5.6)

## 2020-02-12 LAB — T4, FREE: Free T4: 1.12 ng/dL (ref 0.82–1.77)

## 2020-02-12 LAB — VITAMIN D 25 HYDROXY (VIT D DEFICIENCY, FRACTURES): Vit D, 25-Hydroxy: 23.9 ng/mL — ABNORMAL LOW (ref 30.0–100.0)

## 2020-02-12 LAB — T3: T3, Total: 114 ng/dL (ref 71–180)

## 2020-02-12 LAB — INSULIN, RANDOM: INSULIN: 13.2 u[IU]/mL (ref 2.6–24.9)

## 2020-02-12 LAB — TSH: TSH: 1.78 u[IU]/mL (ref 0.450–4.500)

## 2020-02-14 IMAGING — MG MM DIGITAL DIAGNOSTIC UNILAT*R* W/ TOMO
4 series · 4 of 12 positions shown · non-contrast
Comparison: September 05, 2019

CLINICAL DATA: 43-year-old patient presents for evaluation possible
right breast asymmetry seen on recent baseline screening mammogram.

EXAM:
DIGITAL DIAGNOSTIC UNILATERAL RIGHT MAMMOGRAM WITH CAD AND TOMO

[R MLO synth-2D]
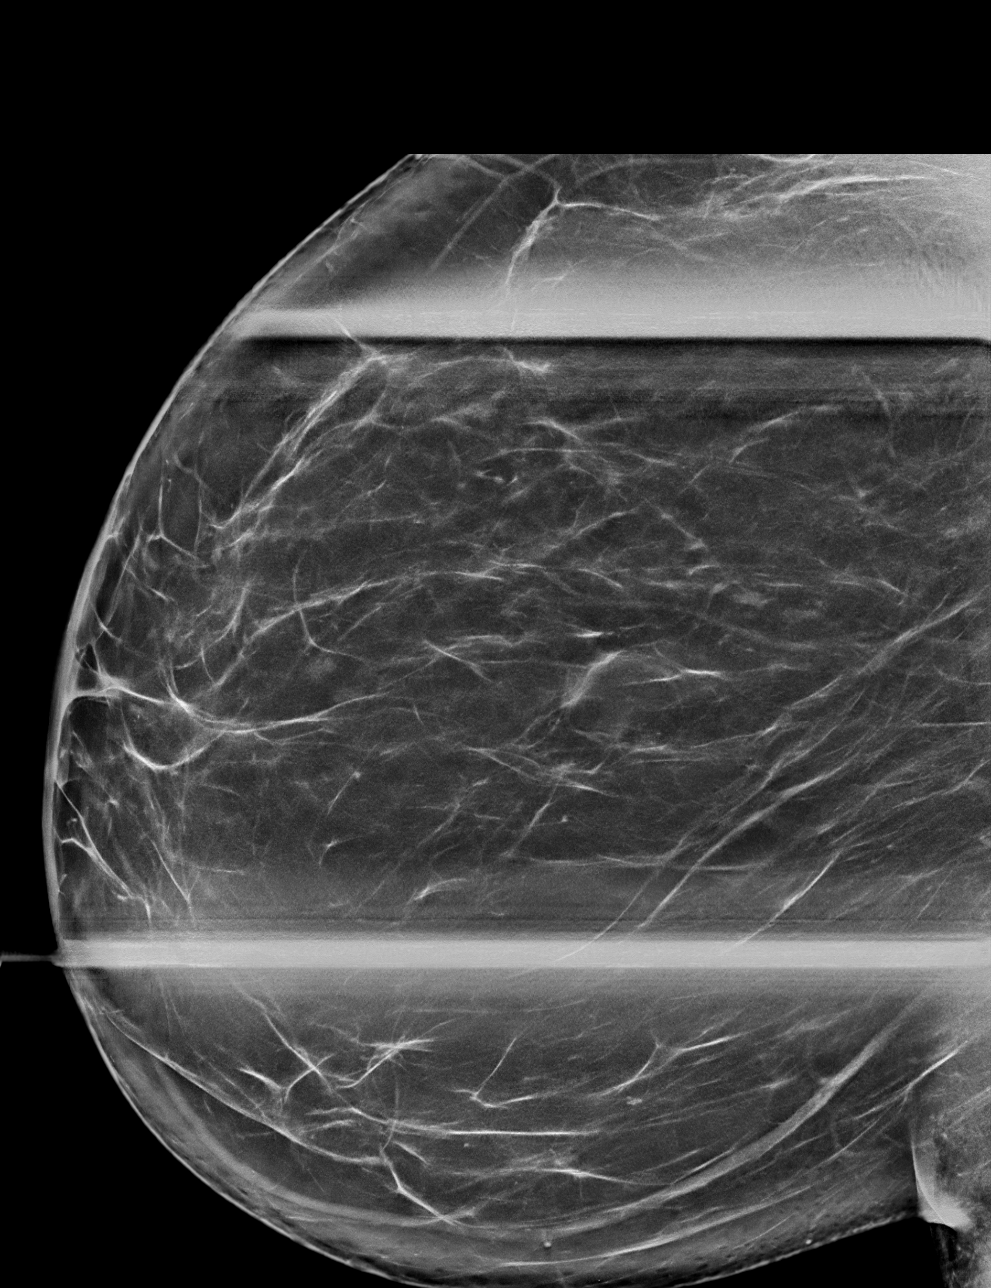

[R CC synth-2D]
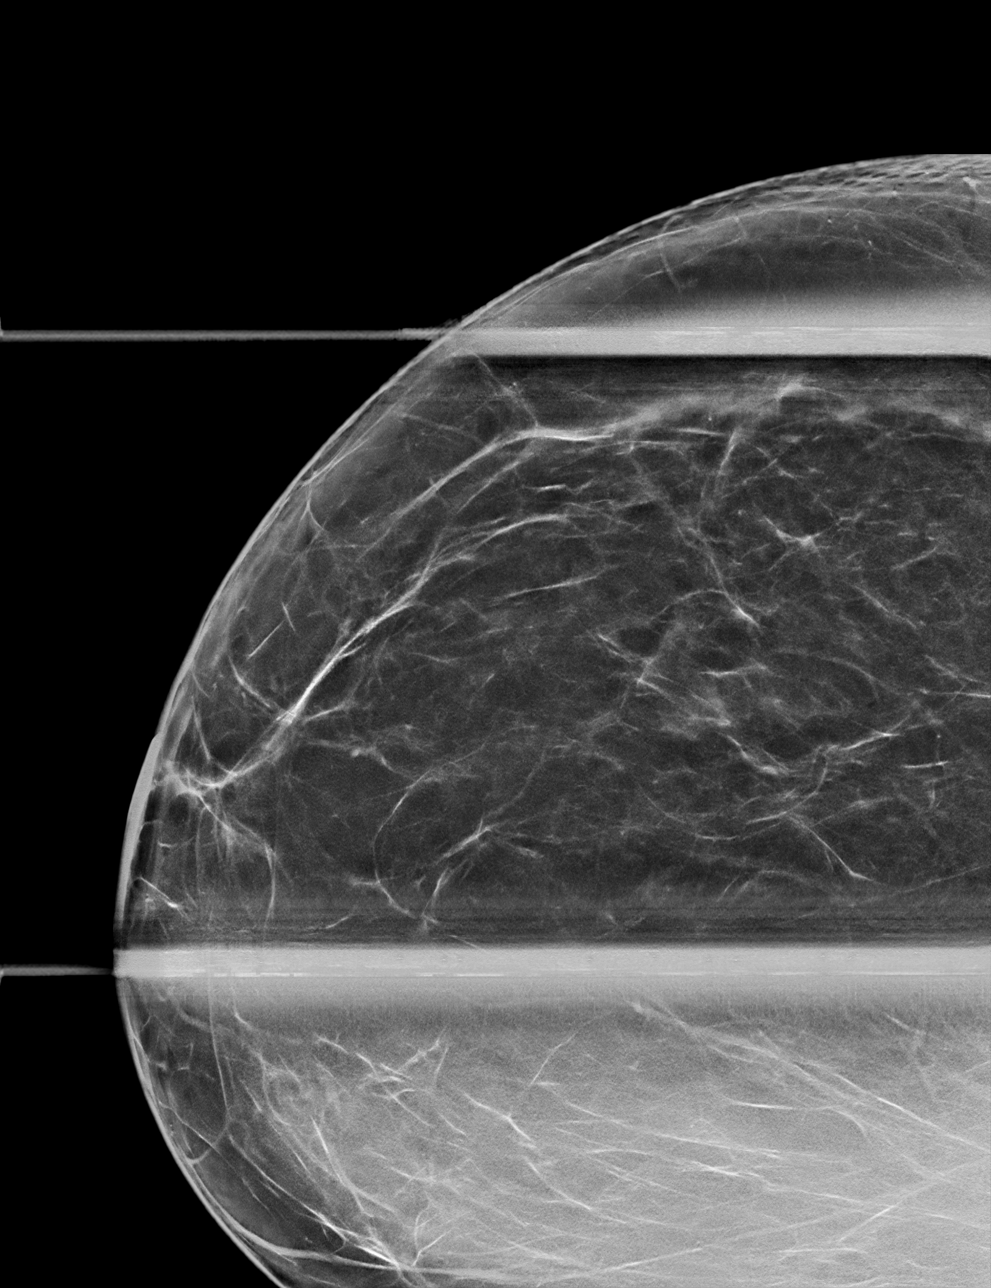

[R MLO tomo · tomo slice 43/86.0]
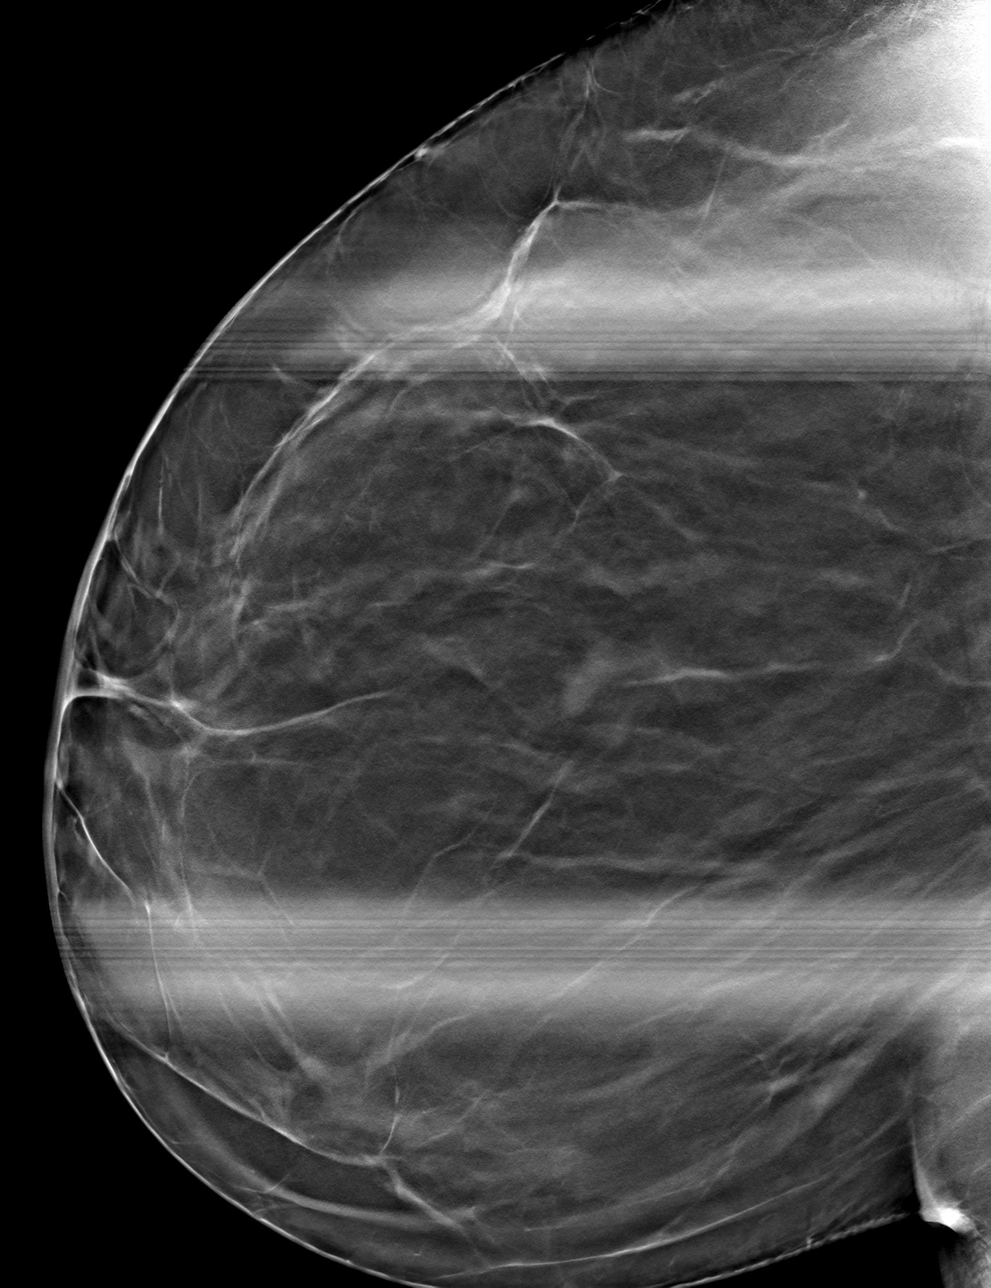

[R CC tomo · tomo slice 39/77.0]
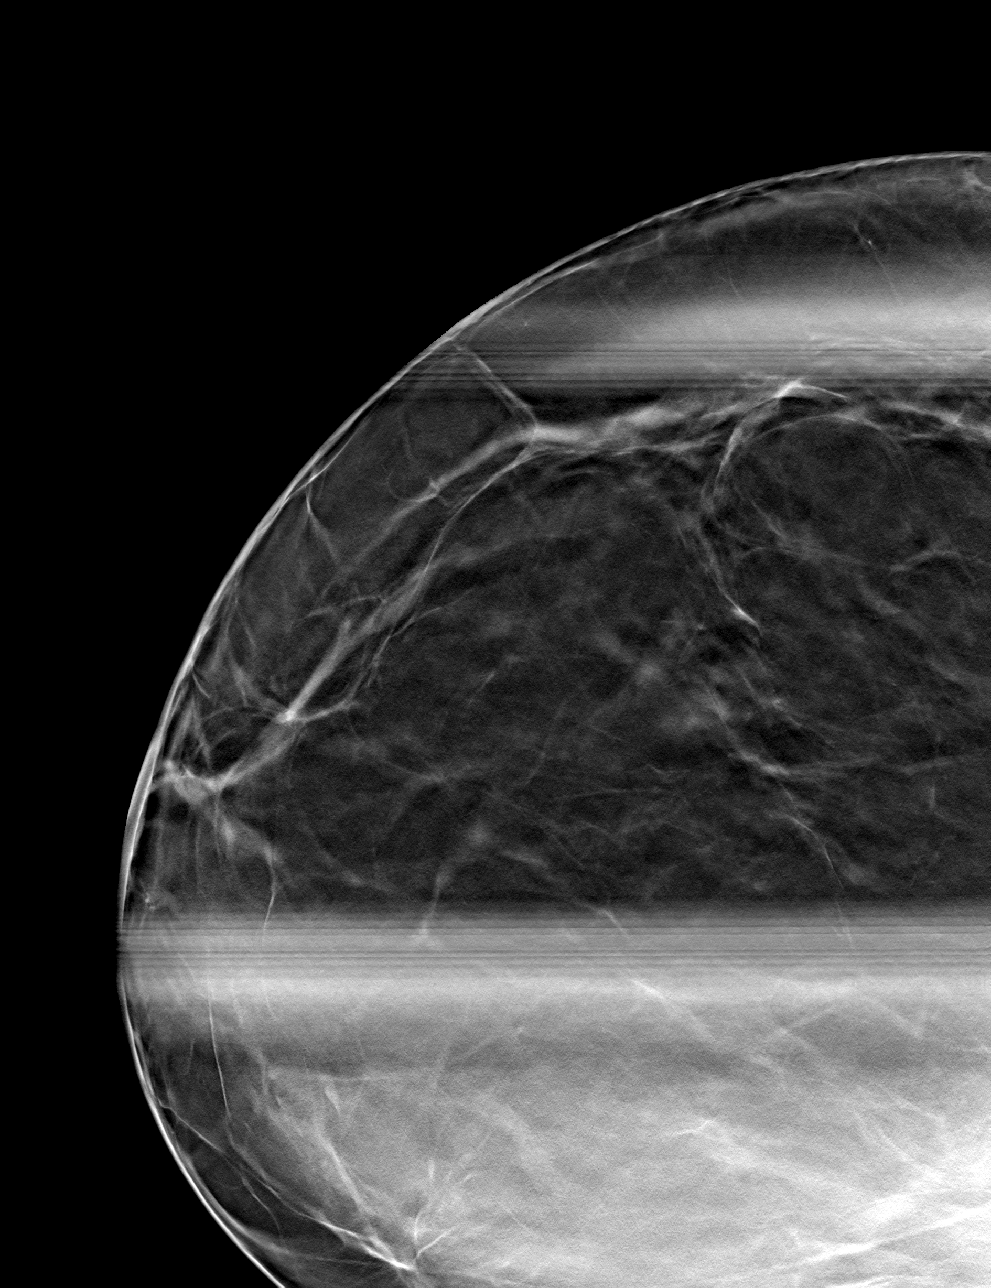

[4 of 12 positions shown; findings below may reference images not displayed]

ACR Breast Density Category b: There are scattered areas of
fibroglandular density.
FINDINGS: Spot compression views the middle third of outer right breast show
dispersion of the asymmetry question on the recent screening
mammogram. This area has an appearance consistent with benign
fibroglandular tissue. There is no mass or architectural distortion.

Mammographic images were processed with CAD.
IMPRESSION: Benign fibroglandular tissue in the outer right breast. No
suspicious findings.

RECOMMENDATION:
Screening mammogram in one year.(Code:GR-[id number])

I have discussed the findings and recommendations with the patient.
If applicable, a reminder letter will be sent to the patient
regarding the next appointment.

BI-RADS CATEGORY  1: Negative.

## 2020-02-25 ENCOUNTER — Other Ambulatory Visit: Payer: Self-pay

## 2020-02-25 ENCOUNTER — Encounter (INDEPENDENT_AMBULATORY_CARE_PROVIDER_SITE_OTHER): Payer: Self-pay | Admitting: Family Medicine

## 2020-02-25 ENCOUNTER — Ambulatory Visit (INDEPENDENT_AMBULATORY_CARE_PROVIDER_SITE_OTHER): Payer: BC Managed Care – PPO | Admitting: Family Medicine

## 2020-02-25 VITALS — BP 112/78 | HR 82 | Temp 98.4°F | Ht 64.0 in | Wt 237.0 lb

## 2020-02-25 DIAGNOSIS — E559 Vitamin D deficiency, unspecified: Secondary | ICD-10-CM

## 2020-02-25 DIAGNOSIS — Z9189 Other specified personal risk factors, not elsewhere classified: Secondary | ICD-10-CM | POA: Diagnosis not present

## 2020-02-25 DIAGNOSIS — Z6841 Body Mass Index (BMI) 40.0 and over, adult: Secondary | ICD-10-CM

## 2020-02-25 DIAGNOSIS — E7849 Other hyperlipidemia: Secondary | ICD-10-CM

## 2020-02-25 DIAGNOSIS — R790 Abnormal level of blood mineral: Secondary | ICD-10-CM | POA: Diagnosis not present

## 2020-02-25 DIAGNOSIS — R7303 Prediabetes: Secondary | ICD-10-CM | POA: Diagnosis not present

## 2020-02-25 DIAGNOSIS — F3289 Other specified depressive episodes: Secondary | ICD-10-CM

## 2020-02-26 MED ORDER — VITAMIN D (ERGOCALCIFEROL) 1.25 MG (50000 UNIT) PO CAPS: 50000.0000 [IU] | ORAL_CAPSULE | ORAL | 0 refills | Status: DC

## 2020-02-26 NOTE — Progress Notes (Signed)
Chief Complaint:   OBESITY Kelly Bruce is here to discuss her progress with her obesity treatment plan along with follow-up of her obesity related diagnoses. Kelly Bruce is on the Category 1 Plan and states she is following her eating plan approximately 98% of the time. Kelly Bruce states she is walking for 20-30 minutes 4 times per week.  Today's visit was #: 2 Starting weight: 242 lbs Starting date: 02/11/2020 Today's weight: 237 lbs Today's date: 02/25/2020 Total lbs lost to date: 5 lbs Total lbs lost since last in-office visit: 5 lbs  Interim History: Kelly Bruce reports polyphagia.  She says she has been following the plan for breakfast, lunch, and dinner.  She has been drinking around 60-80 ounces of water per day as well as unsweetened tea.  She sleeps as well as she can considering she has a toddler.  Subjective:   1. Low ferritin Kelly Bruce is not a vegetarian.  She does not have a history of weight loss surgery.  She is taking an iron supplement.  CBC Latest Ref Rng & Units 02/11/2020 08/29/2019 08/09/2018  WBC 3.4 - 10.8 x10E3/uL 6.7 7.5 8.2  Hemoglobin 11.1 - 15.9 g/dL 14.0 11.3(L) 12.6  Hematocrit 34.0 - 46.6 % 43.3 36.5 37.8  Platelets 150 - 450 x10E3/uL 296 331 325   Lab Results  Component Value Date   IRON 59 02/11/2020   TIBC 428 02/11/2020   FERRITIN 26 02/11/2020   Lab Results  Component Value Date   VITAMINB12 607 02/11/2020   2. Prediabetes Kelly Bruce has a diagnosis of prediabetes based on her elevated HgA1c and was informed this puts her at greater risk of developing diabetes. She continues to work on diet and exercise to decrease her risk of diabetes. She denies nausea or hypoglycemia.  She endorses polyphagia.  Lab Results  Component Value Date   HGBA1C 5.7 (H) 02/11/2020   Lab Results  Component Value Date   INSULIN 13.2 02/11/2020   3. Other hyperlipidemia Kelly Bruce has hyperlipidemia and has been trying to improve her cholesterol levels with intensive lifestyle  modification including a low saturated fat diet, exercise and weight loss. She denies any chest pain, claudication or myalgias.  Lab Results  Component Value Date   ALT 10 02/11/2020   AST 14 02/11/2020   ALKPHOS 70 02/11/2020   BILITOT 0.3 02/11/2020   Lab Results  Component Value Date   CHOL 211 (H) 02/11/2020   HDL 54 02/11/2020   LDLCALC 137 (H) 02/11/2020   TRIG 111 02/11/2020   CHOLHDL 3.9 02/11/2020   4. Vitamin D deficiency Kelly Bruce's Vitamin D level was 23.9 on 02/11/2020. She is currently taking no vitamin D supplement. She denies nausea, vomiting or muscle weakness.  5. Other depression, with emotional eating Kelly Bruce is struggling with emotional eating and using food for comfort to the extent that it is negatively impacting her health. She has been working on behavior modification techniques to help reduce her emotional eating and has been successful. She shows no sign of suicidal or homicidal ideations.  Assessment/Plan:   1. Low ferritin CBC, TIBC shows improvement. Continue to supplement. Orders and follow up as documented in patient record.  Counseling . Iron is essential for our bodies to make red blood cells.  Reasons that someone may be deficient include: an iron-deficient diet (more likely in those following vegan or vegetarian diets), women with heavy menses, patients with GI disorders or poor absorption, patients that have had bariatric surgery, frequent blood donors, patients with cancer,  and patients with heart disease.   Kelly Bruce foods include dark leafy greens, red and white meats, eggs, seafood, and beans.   . Certain foods and drinks prevent your body from absorbing iron properly. Avoid eating these foods in the same meal as iron-rich foods or with iron supplements. These foods include: coffee, black tea, and red wine; milk, dairy products, and foods that are high in calcium; beans and soybeans; whole grains.  . Constipation can be a side effect of iron  supplementation. Increased water and fiber intake are helpful. Water goal: > 2 liters/day. Fiber goal: > 25 grams/day.  2. Prediabetes Kelly Bruce will continue to work on weight loss, exercise, and decreasing simple carbohydrates to help decrease the risk of diabetes.   3. Other hyperlipidemia Cardiovascular risk and specific lipid/LDL goals reviewed.  We discussed several lifestyle modifications today and Kelly Bruce will continue to work on diet, exercise and weight loss efforts. Orders and follow up as documented in patient record.   Counseling Intensive lifestyle modifications are the first line treatment for this issue. . Dietary changes: Increase soluble fiber. Decrease simple carbohydrates. . Exercise changes: Moderate to vigorous-intensity aerobic activity 150 minutes per week if tolerated. . Lipid-lowering medications: see documented in medical record.  4. Vitamin D deficiency Low Vitamin D level contributes to fatigue and are associated with obesity, breast, and colon cancer. She agrees to start to take prescription Vitamin D @50 ,000 IU every week and will follow-up for routine testing of Vitamin D, at least 2-3 times per year to avoid over-replacement.  Orders - Vitamin D, Ergocalciferol, (DRISDOL) 1.25 MG (50000 UNIT) CAPS capsule; Take 1 capsule (50,000 Units total) by mouth every 7 (seven) days.  Dispense: 4 capsule; Refill: 0  5. Other depression, with emotional eating Behavior modification techniques were discussed today to help Kelly Bruce deal with her emotional/non-hunger eating behaviors.  Orders and follow up as documented in patient record.   6. At risk for heart disease Kelly Bruce was given approximately 15 minutes of coronary artery disease prevention counseling today. She is 44 y.o. female and has risk factors for heart disease including obesity. We discussed intensive lifestyle modifications today with an emphasis on specific weight loss instructions and strategies.   Repetitive  spaced learning was employed today to elicit superior memory formation and behavioral change.  7. Class 3 severe obesity with serious comorbidity and body mass index (BMI) of 40.0 to 44.9 in adult, unspecified obesity type (HCC) Kelly Bruce is currently in the action stage of change. As such, her goal is to continue with weight loss efforts. She has agreed to the Category 1 Plan.   Exercise goals: As is.  Behavioral modification strategies: increasing water intake.  Kelly Bruce has agreed to follow-up with our clinic in 2 weeks. She was informed of the importance of frequent follow-up visits to maximize her success with intensive lifestyle modifications for her multiple health conditions.   Objective:   Blood pressure 112/78, pulse 82, temperature 98.4 F (36.9 C), temperature source Oral, height 5\' 4"  (1.626 m), weight 237 lb (107.5 kg), last menstrual period 02/08/2020, SpO2 97 %. Body mass index is 40.68 kg/m.  General: Cooperative, alert, well developed, in no acute distress. HEENT: Conjunctivae and lids unremarkable. Cardiovascular: Regular rhythm.  Lungs: Normal work of breathing. Neurologic: No focal deficits.   Lab Results  Component Value Date   CREATININE 0.65 02/11/2020   BUN 13 02/11/2020   NA 141 02/11/2020   K 4.2 02/11/2020   CL 103 02/11/2020  CO2 21 02/11/2020   Lab Results  Component Value Date   ALT 10 02/11/2020   AST 14 02/11/2020   ALKPHOS 70 02/11/2020   BILITOT 0.3 02/11/2020   Lab Results  Component Value Date   HGBA1C 5.7 (H) 02/11/2020   HGBA1C 5.7 (H) 08/29/2019   Lab Results  Component Value Date   INSULIN 13.2 02/11/2020   Lab Results  Component Value Date   TSH 1.780 02/11/2020   Lab Results  Component Value Date   CHOL 211 (H) 02/11/2020   HDL 54 02/11/2020   LDLCALC 137 (H) 02/11/2020   TRIG 111 02/11/2020   CHOLHDL 3.9 02/11/2020   Lab Results  Component Value Date   WBC 6.7 02/11/2020   HGB 14.0 02/11/2020   HCT 43.3  02/11/2020   MCV 79 02/11/2020   PLT 296 02/11/2020   Lab Results  Component Value Date   IRON 59 02/11/2020   TIBC 428 02/11/2020   FERRITIN 26 02/11/2020   Attestation Statements:   Reviewed by clinician on day of visit: allergies, medications, problem list, medical history, surgical history, family history, social history, and previous encounter notes.  I, Water quality scientist, CMA, am acting as Location manager for [employer], DO.  I have reviewed the above documentation for accuracy and completeness, and I agree with the above. Briscoe Deutscher, DO

## 2020-03-18 ENCOUNTER — Other Ambulatory Visit (INDEPENDENT_AMBULATORY_CARE_PROVIDER_SITE_OTHER): Payer: Self-pay | Admitting: Family Medicine

## 2020-03-18 DIAGNOSIS — E559 Vitamin D deficiency, unspecified: Secondary | ICD-10-CM

## 2020-03-23 ENCOUNTER — Ambulatory Visit (INDEPENDENT_AMBULATORY_CARE_PROVIDER_SITE_OTHER): Payer: BC Managed Care – PPO | Admitting: Family Medicine

## 2020-03-23 ENCOUNTER — Other Ambulatory Visit: Payer: Self-pay

## 2020-03-23 ENCOUNTER — Encounter (INDEPENDENT_AMBULATORY_CARE_PROVIDER_SITE_OTHER): Payer: Self-pay | Admitting: Family Medicine

## 2020-03-23 VITALS — BP 118/64 | HR 64 | Temp 98.6°F | Ht 64.0 in | Wt 231.0 lb

## 2020-03-23 DIAGNOSIS — E7849 Other hyperlipidemia: Secondary | ICD-10-CM

## 2020-03-23 DIAGNOSIS — Z9189 Other specified personal risk factors, not elsewhere classified: Secondary | ICD-10-CM

## 2020-03-23 DIAGNOSIS — E559 Vitamin D deficiency, unspecified: Secondary | ICD-10-CM | POA: Diagnosis not present

## 2020-03-23 DIAGNOSIS — R7303 Prediabetes: Secondary | ICD-10-CM | POA: Diagnosis not present

## 2020-03-23 DIAGNOSIS — Z6839 Body mass index (BMI) 39.0-39.9, adult: Secondary | ICD-10-CM

## 2020-03-23 DIAGNOSIS — N841 Polyp of cervix uteri: Secondary | ICD-10-CM | POA: Insufficient documentation

## 2020-03-23 MED ORDER — VITAMIN D (ERGOCALCIFEROL) 1.25 MG (50000 UNIT) PO CAPS: 50000.0000 [IU] | ORAL_CAPSULE | ORAL | 0 refills | Status: DC

## 2020-03-23 NOTE — Progress Notes (Signed)
Chief Complaint:   OBESITY Kelly Bruce is here to discuss her progress with her obesity treatment plan along with follow-up of her obesity related diagnoses. [place] is on the Category 1 Plan and states she is following her eating plan approximately 80% of the time. Kelly Bruce states she is exercising for 0 minutes 0 times per week.  Today's visit was #: 3 Starting weight: 242 lbs Starting date: 02/11/2020 Today's weight: 231 lbs Today's date: 03/23/2020 Total lbs lost to date: 11 lbs Total lbs lost since last in-office visit: 6 lbs  Interim History: Kelly Bruce says that it is "hard not to eat good things".  She misses caramel lattes.  We reviewed ways to have a low calories substitute.   Subjective:   1. Vitamin D deficiency Kelly Bruce's Vitamin D level was 23.9 on 02/11/2020. She is currently taking prescription vitamin D 50,000 IU each week. She denies nausea, vomiting or muscle weakness.  2. Prediabetes Kelly Bruce has a diagnosis of prediabetes based on her elevated HgA1c and was informed this puts her at greater risk of developing diabetes. She continues to work on diet and exercise to decrease her risk of diabetes. She denies nausea or hypoglycemia.  Lab Results  Component Value Date   HGBA1C 5.7 (H) 02/11/2020   Lab Results  Component Value Date   INSULIN 13.2 02/11/2020   3. Other hyperlipidemia Kelly Bruce has hyperlipidemia and has been trying to improve her cholesterol levels with intensive lifestyle modification including a low saturated fat diet, exercise and weight loss. She denies any chest pain, claudication or myalgias.  Lab Results  Component Value Date   ALT 10 02/11/2020   AST 14 02/11/2020   ALKPHOS 70 02/11/2020   BILITOT 0.3 02/11/2020   Lab Results  Component Value Date   CHOL 211 (H) 02/11/2020   HDL 54 02/11/2020   LDLCALC 137 (H) 02/11/2020   TRIG 111 02/11/2020   CHOLHDL 3.9 02/11/2020   Assessment/Plan:   1. Vitamin D deficiency Low Vitamin D level  contributes to fatigue and are associated with obesity, breast, and colon cancer. She agrees to continue to take prescription Vitamin D @50 ,000 IU every week and will follow-up for routine testing of Vitamin D, at least 2-3 times per year to avoid over-replacement.  Orders - Vitamin D, Ergocalciferol, (DRISDOL) 1.25 MG (50000 UNIT) CAPS capsule; Take 1 capsule (50,000 Units total) by mouth every 7 (seven) days.  Dispense: 4 capsule; Refill: 0  2. Prediabetes Kelly Bruce will continue to work on weight loss, exercise, and decreasing simple carbohydrates to help decrease the risk of diabetes.   3. Other hyperlipidemia Cardiovascular risk and specific lipid/LDL goals reviewed.  We discussed several lifestyle modifications today and Kelly Bruce will continue to work on diet, exercise and weight loss efforts. Orders and follow up as documented in patient record.   Counseling Intensive lifestyle modifications are the first line treatment for this issue. . Dietary changes: Increase soluble fiber. Decrease simple carbohydrates. . Exercise changes: Moderate to vigorous-intensity aerobic activity 150 minutes per week if tolerated. . Lipid-lowering medications: see documented in medical record.  4. At risk for diabetes mellitus Kelly Bruce was given approximately 15 minutes of diabetes education and counseling today. We discussed intensive lifestyle modifications today with an emphasis on weight loss as well as increasing exercise and decreasing simple carbohydrates in her diet. We also reviewed medication options with an emphasis on risk versus benefit of those discussed.   Repetitive spaced learning was employed today to elicit superior memory formation  and behavioral change.  5. Class 2 severe obesity with serious comorbidity and body mass index (BMI) of 39.0 to 39.9 in adult, unspecified obesity type (HCC) Kelly Bruce is currently in the action stage of change. As such, her goal is to continue with weight loss  efforts. She has agreed to the Category 1 Plan.   Exercise goals: Plans to ride bike with son for 1 mile.  Behavioral modification strategies: increasing lean protein intake and increasing water intake.  Kelly Bruce has agreed to follow-up with our clinic in 2 weeks. She was informed of the importance of frequent follow-up visits to maximize her success with intensive lifestyle modifications for her multiple health conditions.   Objective:   Blood pressure 118/64, pulse 64, temperature 98.6 F (37 C), temperature source Oral, height 5\' 4"  (1.626 m), weight 231 lb (104.8 kg), last menstrual period 03/03/2020, SpO2 95 %. Body mass index is 39.65 kg/m.  General: Cooperative, alert, well developed, in no acute distress. HEENT: Conjunctivae and lids unremarkable. Cardiovascular: Regular rhythm.  Lungs: Normal work of breathing. Neurologic: No focal deficits.   Lab Results  Component Value Date   CREATININE 0.65 02/11/2020   BUN 13 02/11/2020   NA 141 02/11/2020   K 4.2 02/11/2020   CL 103 02/11/2020   CO2 21 02/11/2020   Lab Results  Component Value Date   ALT 10 02/11/2020   AST 14 02/11/2020   ALKPHOS 70 02/11/2020   BILITOT 0.3 02/11/2020   Lab Results  Component Value Date   HGBA1C 5.7 (H) 02/11/2020   HGBA1C 5.7 (H) 08/29/2019   Lab Results  Component Value Date   INSULIN 13.2 02/11/2020   Lab Results  Component Value Date   TSH 1.780 02/11/2020   Lab Results  Component Value Date   CHOL 211 (H) 02/11/2020   HDL 54 02/11/2020   LDLCALC 137 (H) 02/11/2020   TRIG 111 02/11/2020   CHOLHDL 3.9 02/11/2020   Lab Results  Component Value Date   WBC 6.7 02/11/2020   HGB 14.0 02/11/2020   HCT 43.3 02/11/2020   MCV 79 02/11/2020   PLT 296 02/11/2020   Lab Results  Component Value Date   IRON 59 02/11/2020   TIBC 428 02/11/2020   FERRITIN 26 02/11/2020   Attestation Statements:   Reviewed by clinician on day of visit: allergies, medications, problem list,  medical history, surgical history, family history, social history, and previous encounter notes.  I, Water quality scientist, CMA, am acting as Location manager for [employer], DO.  I have reviewed the above documentation for accuracy and completeness, and I agree with the above. Briscoe Deutscher, DO

## 2020-04-06 ENCOUNTER — Other Ambulatory Visit: Payer: Self-pay

## 2020-04-06 ENCOUNTER — Ambulatory Visit (INDEPENDENT_AMBULATORY_CARE_PROVIDER_SITE_OTHER): Payer: BC Managed Care – PPO | Admitting: Family Medicine

## 2020-04-06 ENCOUNTER — Encounter (INDEPENDENT_AMBULATORY_CARE_PROVIDER_SITE_OTHER): Payer: Self-pay | Admitting: Family Medicine

## 2020-04-06 VITALS — BP 113/77 | HR 59 | Temp 98.3°F | Ht 64.0 in | Wt 226.0 lb

## 2020-04-06 DIAGNOSIS — E559 Vitamin D deficiency, unspecified: Secondary | ICD-10-CM | POA: Diagnosis not present

## 2020-04-06 DIAGNOSIS — R7303 Prediabetes: Secondary | ICD-10-CM

## 2020-04-06 DIAGNOSIS — Z6838 Body mass index (BMI) 38.0-38.9, adult: Secondary | ICD-10-CM

## 2020-04-06 DIAGNOSIS — E7849 Other hyperlipidemia: Secondary | ICD-10-CM | POA: Diagnosis not present

## 2020-04-06 DIAGNOSIS — Z9189 Other specified personal risk factors, not elsewhere classified: Secondary | ICD-10-CM

## 2020-04-06 NOTE — Progress Notes (Signed)
Chief Complaint:   OBESITY Kelly Bruce is here to discuss her progress with her obesity treatment plan along with follow-up of her obesity related diagnoses. Kelly Bruce is on the Category 1 Plan and states she is following her eating plan approximately 75-80% of the time. Kelly Bruce states she is walking for 20 minutes 3 times per week.  Today's visit was #: 4 Starting weight: 242 lbs Starting date: 02/11/2020 Today's weight: 226 lbs Today's date: 04/06/2020 Total lbs lost to date: 16 lbs Total lbs lost since last in-office visit: 5 lbs  Interim History: Kelly Bruce is down 16 pounds today.  She says she is sleeping 7 hours at night.  She has a 42-year-old at home.  She plans to try lots of parks over the summer.  Subjective:   1. Other hyperlipidemia [place] has hyperlipidemia and has been trying to improve her cholesterol levels with intensive lifestyle modification including a low saturated fat diet, exercise and weight loss. She denies any chest pain, claudication or myalgias.  Lab Results  Component Value Date   ALT 10 02/11/2020   AST 14 02/11/2020   ALKPHOS 70 02/11/2020   BILITOT 0.3 02/11/2020   Lab Results  Component Value Date   CHOL 211 (H) 02/11/2020   HDL 54 02/11/2020   LDLCALC 137 (H) 02/11/2020   TRIG 111 02/11/2020   CHOLHDL 3.9 02/11/2020   2. Prediabetes Kelly Bruce has a diagnosis of prediabetes based on her elevated HgA1c and was informed this puts her at greater risk of developing diabetes. She continues to work on diet and exercise to decrease her risk of diabetes. She denies nausea or hypoglycemia.  Lab Results  Component Value Date   HGBA1C 5.7 (H) 02/11/2020   Lab Results  Component Value Date   INSULIN 13.2 02/11/2020   3. Vitamin D deficiency Kelly Bruce's Vitamin D level was 23.9 on 02/11/2020. She is currently taking prescription vitamin D 50,000 IU each week. She denies nausea, vomiting or muscle weakness.  Assessment/Plan:   1. Other  hyperlipidemia Cardiovascular risk and specific lipid/LDL goals reviewed.  We discussed several lifestyle modifications today and Alassandra will continue to work on diet, exercise and weight loss efforts. Orders and follow up as documented in patient record.   Counseling Intensive lifestyle modifications are the first line treatment for this issue. . Dietary changes: Increase soluble fiber. Decrease simple carbohydrates. . Exercise changes: Moderate to vigorous-intensity aerobic activity 150 minutes per week if tolerated. . Lipid-lowering medications: see documented in medical record.  2. Prediabetes Kelly Bruce will continue to work on weight loss, exercise, and decreasing simple carbohydrates to help decrease the risk of diabetes.   3. Vitamin D deficiency Low Vitamin D level contributes to fatigue and are associated with obesity, breast, and colon cancer. She agrees to continue to take prescription Vitamin D @50 ,000 IU every week and will follow-up for routine testing of Vitamin D, at least 2-3 times per year to avoid over-replacement.  4. At risk for heart disease Kelly Bruce was given approximately 15 minutes of diabetes education and counseling today. We discussed intensive lifestyle modifications today with an emphasis on weight loss as well as increasing exercise and decreasing simple carbohydrates in her diet. We also reviewed medication options with an emphasis on risk versus benefit of those discussed.   Repetitive spaced learning was employed today to elicit superior memory formation and behavioral change.  5. Class 2 severe obesity with serious comorbidity and body mass index (BMI) of 38.0 to 38.9 in adult, unspecified  obesity type [facility]) Kelly Bruce is currently in the action stage of change. As such, her goal is to continue with weight loss efforts. She has agreed to the Category 1 Plan.   Exercise goals: For substantial health benefits, adults should do at least 150 minutes (2 hours and 30  minutes) a week of moderate-intensity, or 75 minutes (1 hour and 15 minutes) a week of vigorous-intensity aerobic physical activity, or an equivalent combination of moderate- and vigorous-intensity aerobic activity. Aerobic activity should be performed in episodes of at least 10 minutes, and preferably, it should be spread throughout the week.  Behavioral modification strategies: increasing lean protein intake and increasing water intake.  Kelly Bruce has agreed to follow-up with our clinic in 2 weeks. She was informed of the importance of frequent follow-up visits to maximize her success with intensive lifestyle modifications for her multiple health conditions.   Objective:   Blood pressure 113/77, pulse (!) 59, temperature 98.3 F (36.8 C), temperature source Oral, height 5\' 4"  (1.626 m), weight 226 lb (102.5 kg), last menstrual period 03/31/2020, SpO2 98 %. Body mass index is 38.79 kg/m.  General: Cooperative, alert, well developed, in no acute distress. HEENT: Conjunctivae and lids unremarkable. Cardiovascular: Regular rhythm.  Lungs: Normal work of breathing. Neurologic: No focal deficits.   Lab Results  Component Value Date   CREATININE 0.65 02/11/2020   BUN 13 02/11/2020   NA 141 02/11/2020   K 4.2 02/11/2020   CL 103 02/11/2020   CO2 21 02/11/2020   Lab Results  Component Value Date   ALT 10 02/11/2020   AST 14 02/11/2020   ALKPHOS 70 02/11/2020   BILITOT 0.3 02/11/2020   Lab Results  Component Value Date   HGBA1C 5.7 (H) 02/11/2020   HGBA1C 5.7 (H) 08/29/2019   Lab Results  Component Value Date   INSULIN 13.2 02/11/2020   Lab Results  Component Value Date   TSH 1.780 02/11/2020   Lab Results  Component Value Date   CHOL 211 (H) 02/11/2020   HDL 54 02/11/2020   LDLCALC 137 (H) 02/11/2020   TRIG 111 02/11/2020   CHOLHDL 3.9 02/11/2020   Lab Results  Component Value Date   WBC 6.7 02/11/2020   HGB 14.0 02/11/2020   HCT 43.3 02/11/2020   MCV 79 02/11/2020    PLT 296 02/11/2020   Lab Results  Component Value Date   IRON 59 02/11/2020   TIBC 428 02/11/2020   FERRITIN 26 02/11/2020   Attestation Statements:   Reviewed by clinician on day of visit: allergies, medications, problem list, medical history, surgical history, family history, social history, and previous encounter notes.  I, Water quality scientist, CMA, am acting as Location manager for [employer], DO.  I have reviewed the above documentation for accuracy and completeness, and I agree with the above. Briscoe Deutscher, DO

## 2020-04-13 ENCOUNTER — Other Ambulatory Visit (INDEPENDENT_AMBULATORY_CARE_PROVIDER_SITE_OTHER): Payer: Self-pay | Admitting: Family Medicine

## 2020-04-13 DIAGNOSIS — E559 Vitamin D deficiency, unspecified: Secondary | ICD-10-CM

## 2020-05-04 ENCOUNTER — Other Ambulatory Visit (INDEPENDENT_AMBULATORY_CARE_PROVIDER_SITE_OTHER): Payer: Self-pay | Admitting: Family Medicine

## 2020-05-04 ENCOUNTER — Other Ambulatory Visit: Payer: Self-pay

## 2020-05-04 ENCOUNTER — Ambulatory Visit (INDEPENDENT_AMBULATORY_CARE_PROVIDER_SITE_OTHER): Payer: BC Managed Care – PPO | Admitting: Family Medicine

## 2020-05-04 VITALS — BP 100/67 | HR 63 | Temp 97.8°F | Ht 64.0 in | Wt 225.0 lb

## 2020-05-04 DIAGNOSIS — E7849 Other hyperlipidemia: Secondary | ICD-10-CM

## 2020-05-04 DIAGNOSIS — R7303 Prediabetes: Secondary | ICD-10-CM

## 2020-05-04 DIAGNOSIS — Z9189 Other specified personal risk factors, not elsewhere classified: Secondary | ICD-10-CM | POA: Diagnosis not present

## 2020-05-04 DIAGNOSIS — E559 Vitamin D deficiency, unspecified: Secondary | ICD-10-CM

## 2020-05-04 DIAGNOSIS — Z6838 Body mass index (BMI) 38.0-38.9, adult: Secondary | ICD-10-CM

## 2020-05-04 MED ORDER — METFORMIN HCL 500 MG PO TABS: 500.0000 mg | ORAL_TABLET | Freq: Every day | ORAL | 0 refills | Status: DC

## 2020-05-04 NOTE — Progress Notes (Signed)
Chief Complaint:   OBESITY Kelly Bruce is here to discuss her progress with her obesity treatment plan along with follow-up of her obesity related diagnoses. Kelly Bruce is on the Category 1 Plan and states she is following her eating plan approximately 70% of the time. Kelly Bruce states she is walking and/or biking for 20 minutes 3-4 times per week.  Today's visit was #: 5 Starting weight: 242 lbs Starting date: 02/11/2020 Today's weight: 225 lbs Today's date: 05/04/2020 Total lbs lost to date: 17 lbs Total lbs lost since last in-office visit: 1 lb  Interim History: Kelly Bruce says she has had a lot of celebrations and about 1 week of travel since her last visit.  She endorses increased hunger at night.  Subjective:   1. Prediabetes Kelly Bruce has a diagnosis of prediabetes based on her elevated HgA1c and was informed this puts her at greater risk of developing diabetes. She continues to work on diet and exercise to decrease her risk of diabetes. She denies nausea or hypoglycemia.  Lab Results  Component Value Date   HGBA1C 5.7 (H) 02/11/2020   Lab Results  Component Value Date   INSULIN 13.2 02/11/2020   2. Vitamin D deficiency Kelly Bruce's Vitamin D level was 23.9 on 02/11/2020. She is currently taking prescription vitamin D 50,000 IU each week. She denies nausea, vomiting or muscle weakness.  3. Other hyperlipidemia Kelly Bruce has hyperlipidemia and has been trying to improve her cholesterol levels with intensive lifestyle modification including a low saturated fat diet, exercise and weight loss. She denies any chest pain, claudication or myalgias.  Lab Results  Component Value Date   ALT 10 02/11/2020   AST 14 02/11/2020   ALKPHOS 70 02/11/2020   BILITOT 0.3 02/11/2020   Lab Results  Component Value Date   CHOL 211 (H) 02/11/2020   HDL 54 02/11/2020   LDLCALC 137 (H) 02/11/2020   TRIG 111 02/11/2020   CHOLHDL 3.9 02/11/2020   4. At risk for heart disease Kelly Bruce is at a higher  than average risk for cardiovascular disease due to obesity.   Assessment/Plan:   1. Prediabetes Kelly Bruce will continue to work on weight loss, exercise, and decreasing simple carbohydrates to help decrease the risk of diabetes.   Orders - metFORMIN (GLUCOPHAGE) 500 MG tablet; Take 1 tablet (500 mg total) by mouth daily.  Dispense: 30 tablet; Refill: 0  2. Vitamin D deficiency Low Vitamin D level contributes to fatigue and are associated with obesity, breast, and colon cancer. She agrees to continue to take prescription Vitamin D @50 ,000 IU every week and will follow-up for routine testing of Vitamin D, at least 2-3 times per year to avoid over-replacement.  3. Other hyperlipidemia Cardiovascular risk and specific lipid/LDL goals reviewed.  We discussed several lifestyle modifications today and Kelly Bruce will continue to work on diet, exercise and weight loss efforts. Orders and follow up as documented in patient record.   Counseling Intensive lifestyle modifications are the first line treatment for this issue. . Dietary changes: Increase soluble fiber. Decrease simple carbohydrates. . Exercise changes: Moderate to vigorous-intensity aerobic activity 150 minutes per week if tolerated. . Lipid-lowering medications: see documented in medical record.  4. At risk for heart disease Kelly Bruce was given approximately 15 minutes of coronary artery disease prevention counseling today. She is 44 y.o. female and has risk factors for heart disease including obesity. We discussed intensive lifestyle modifications today with an emphasis on specific weight loss instructions and strategies.   Repetitive spaced learning was  employed today to elicit superior memory formation and behavioral change.  5. Class 2 severe obesity with serious comorbidity and body mass index (BMI) of 38.0 to 38.9 in adult, unspecified obesity type (HCC) Kelly Bruce is currently in the action stage of change. As such, her goal is to  continue with weight loss efforts. She has agreed to the Category 1 Plan.   Exercise goals: For substantial health benefits, adults should do at least 150 minutes (2 hours and 30 minutes) a week of moderate-intensity, or 75 minutes (1 hour and 15 minutes) a week of vigorous-intensity aerobic physical activity, or an equivalent combination of moderate- and vigorous-intensity aerobic activity. Aerobic activity should be performed in episodes of at least 10 minutes, and preferably, it should be spread throughout the week.  Behavioral modification strategies: increasing lean protein intake and increasing water intake.  Kelly Bruce has agreed to follow-up with our clinic in 2-3 weeks. She was informed of the importance of frequent follow-up visits to maximize her success with intensive lifestyle modifications for her multiple health conditions.   Objective:   Blood pressure 100/67, pulse 63, temperature 97.8 F (36.6 C), temperature source Oral, height 5\' 4"  (1.626 m), weight 225 lb (102.1 kg), SpO2 97 %. Body mass index is 38.62 kg/m.  General: Cooperative, alert, well developed, in no acute distress. HEENT: Conjunctivae and lids unremarkable. Cardiovascular: Regular rhythm.  Lungs: Normal work of breathing. Neurologic: No focal deficits.   Lab Results  Component Value Date   CREATININE 0.65 02/11/2020   BUN 13 02/11/2020   NA 141 02/11/2020   K 4.2 02/11/2020   CL 103 02/11/2020   CO2 21 02/11/2020   Lab Results  Component Value Date   ALT 10 02/11/2020   AST 14 02/11/2020   ALKPHOS 70 02/11/2020   BILITOT 0.3 02/11/2020   Lab Results  Component Value Date   HGBA1C 5.7 (H) 02/11/2020   HGBA1C 5.7 (H) 08/29/2019   Lab Results  Component Value Date   INSULIN 13.2 02/11/2020   Lab Results  Component Value Date   TSH 1.780 02/11/2020   Lab Results  Component Value Date   CHOL 211 (H) 02/11/2020   HDL 54 02/11/2020   LDLCALC 137 (H) 02/11/2020   TRIG 111 02/11/2020    CHOLHDL 3.9 02/11/2020   Lab Results  Component Value Date   WBC 6.7 02/11/2020   HGB 14.0 02/11/2020   HCT 43.3 02/11/2020   MCV 79 02/11/2020   PLT 296 02/11/2020   Lab Results  Component Value Date   IRON 59 02/11/2020   TIBC 428 02/11/2020   FERRITIN 26 02/11/2020   Attestation Statements:   Reviewed by clinician on day of visit: allergies, medications, problem list, medical history, surgical history, family history, social history, and previous encounter notes.  I, Water quality scientist, CMA, am acting as transcriptionist for Briscoe Deutscher, DO  I have reviewed the above documentation for accuracy and completeness, and I agree with the above. Briscoe Deutscher, DO

## 2020-05-05 MED ORDER — VITAMIN D (ERGOCALCIFEROL) 1.25 MG (50000 UNIT) PO CAPS: 50000.0000 [IU] | ORAL_CAPSULE | ORAL | 0 refills | Status: DC

## 2020-05-11 ENCOUNTER — Encounter (INDEPENDENT_AMBULATORY_CARE_PROVIDER_SITE_OTHER): Payer: Self-pay | Admitting: Family Medicine

## 2020-05-27 ENCOUNTER — Other Ambulatory Visit: Payer: Self-pay

## 2020-05-27 ENCOUNTER — Ambulatory Visit (INDEPENDENT_AMBULATORY_CARE_PROVIDER_SITE_OTHER): Payer: BC Managed Care – PPO | Admitting: Family Medicine

## 2020-05-27 ENCOUNTER — Encounter (INDEPENDENT_AMBULATORY_CARE_PROVIDER_SITE_OTHER): Payer: Self-pay | Admitting: Family Medicine

## 2020-05-27 ENCOUNTER — Other Ambulatory Visit (INDEPENDENT_AMBULATORY_CARE_PROVIDER_SITE_OTHER): Payer: Self-pay | Admitting: Family Medicine

## 2020-05-27 ENCOUNTER — Telehealth: Payer: Self-pay | Admitting: Osteopathic Medicine

## 2020-05-27 VITALS — BP 98/64 | HR 55 | Temp 97.9°F | Ht 64.0 in | Wt 221.0 lb

## 2020-05-27 DIAGNOSIS — E559 Vitamin D deficiency, unspecified: Secondary | ICD-10-CM

## 2020-05-27 DIAGNOSIS — R7303 Prediabetes: Secondary | ICD-10-CM

## 2020-05-27 DIAGNOSIS — Z9189 Other specified personal risk factors, not elsewhere classified: Secondary | ICD-10-CM

## 2020-05-27 DIAGNOSIS — N939 Abnormal uterine and vaginal bleeding, unspecified: Secondary | ICD-10-CM

## 2020-05-27 DIAGNOSIS — Z6838 Body mass index (BMI) 38.0-38.9, adult: Secondary | ICD-10-CM

## 2020-05-27 MED ORDER — METFORMIN HCL 500 MG PO TABS: 500.0000 mg | ORAL_TABLET | Freq: Every day | ORAL | 0 refills | Status: DC

## 2020-05-27 MED ORDER — VITAMIN D (ERGOCALCIFEROL) 1.25 MG (50000 UNIT) PO CAPS: 50000.0000 [IU] | ORAL_CAPSULE | ORAL | 0 refills | Status: DC

## 2020-05-27 NOTE — Telephone Encounter (Signed)
Please advise sent from the front.

## 2020-05-27 NOTE — Telephone Encounter (Signed)
PT contacted through [facility] appointment request.  Please advise.  Dr. Sheppard Coil, Heavy period lasting more than 2 weeks (started June 22) .Very heavy last 2 days, saturating multiple sanitary pads.  Cramping but no other symptoms.   Concern polyps may be back (surgery 4 years ago to remove).  Contacted OB-GYN, couldn't see me until August.  Talked with Dr. Juleen China, she said to contact you and request Vaginal Ultrasound. Thank you

## 2020-05-28 NOTE — Telephone Encounter (Signed)
Needs appt

## 2020-05-28 NOTE — Telephone Encounter (Signed)
Appointment has been made. No further questions at this time.  

## 2020-05-28 NOTE — Progress Notes (Signed)
Chief Complaint:   OBESITY Kelly Bruce is here to discuss her progress with her obesity treatment plan along with follow-up of her obesity related diagnoses. Kelly Bruce is on the Category 1 Plan and states she is following her eating plan approximately 75% of the time. Kelly Bruce states she is walking and gardening with her toddler.  Today's visit was #: 6 Starting weight: 242 lbs Starting date: 02/11/2020 Today's weight: 221 lbs Today's date: 05/27/2020 Total lbs lost to date: 21 lbs Total lbs lost since last in-office visit: 4 lbs  Interim History: Kelly Bruce is down 21 pounds today.  She says she is doing well and is happy with the plan.    Note:  Kelly Bruce will not have insurance during the month of August.  Will have labs drawn when fasting.  Subjective:   1. Vitamin D deficiency Kelly Bruce's Vitamin D level was 23.9 on 02/11/2020. She is currently taking prescription vitamin D 50,000 IU each week. She denies nausea, vomiting or muscle weakness.  2. Prediabetes Kelly Bruce has a diagnosis of prediabetes based on her elevated HgA1c and was informed this puts her at greater risk of developing diabetes. She continues to work on diet and exercise to decrease her risk of diabetes. She denies nausea or hypoglycemia.  Lab Results  Component Value Date   HGBA1C 5.7 (H) 02/11/2020   Lab Results  Component Value Date   INSULIN 13.2 02/11/2020   3. Vaginal bleeding Kelly Bruce has had vaginal bleeding for 2 weeks.  She has a history of polyps.  Assessment/Plan:   1. Vitamin D deficiency Low Vitamin D level contributes to fatigue and are associated with obesity, breast, and colon cancer. She agrees to continue to take prescription Vitamin D @50 ,000 IU every week and will follow-up for routine testing of Vitamin D, at least 2-3 times per year to avoid over-replacement.  Orders - Anemia panel - CBC with Differential/Platelet - Comprehensive metabolic panel - Hemoglobin A1c - Insulin, random - Lipid  Panel With LDL/HDL Ratio - T3 - T4, free - TSH - VITAMIN D 25 Hydroxy (Vit-D Deficiency, Fractures) - Vitamin D, Ergocalciferol, (DRISDOL) 1.25 MG (50000 UNIT) CAPS capsule; Take 1 capsule (50,000 Units total) by mouth every 7 (seven) days.  Dispense: 12 capsule; Refill: 0  2. Prediabetes Kelly Bruce will continue to work on weight loss, exercise, and decreasing simple carbohydrates to help decrease the risk of diabetes.   Orders - Anemia panel - CBC with Differential/Platelet - Comprehensive metabolic panel - Hemoglobin A1c - Insulin, random - Lipid Panel With LDL/HDL Ratio - T3 - T4, free - TSH - VITAMIN D 25 Hydroxy (Vit-D Deficiency, Fractures) - metFORMIN (GLUCOPHAGE) 500 MG tablet; Take 1 tablet (500 mg total) by mouth daily.  Dispense: 90 tablet; Refill: 0  3. Vaginal bleeding She will call her PCP/GYN regarding vaginal bleeding.  Orders - Anemia panel - CBC with Differential/Platelet - Comprehensive metabolic panel - Hemoglobin A1c - Insulin, random - Lipid Panel With LDL/HDL Ratio - T3 - T4, free - TSH - VITAMIN D 25 Hydroxy (Vit-D Deficiency, Fractures)  4. At risk for heart disease Kelly Bruce was given approximately 15 minutes of coronary artery disease prevention counseling today. She is 44 y.o. female and has risk factors for heart disease including obesity. We discussed intensive lifestyle modifications today with an emphasis on specific weight loss instructions and strategies.   Repetitive spaced learning was employed today to elicit superior memory formation and behavioral change.  5. Class 2 severe obesity with serious comorbidity  and body mass index (BMI) of 38.0 to 38.9 in adult, unspecified obesity type (HCC) Kelly Bruce is currently in the action stage of change. As such, her goal is to continue with weight loss efforts. She has agreed to the Category 1 Plan.   Exercise goals: For substantial health benefits, adults should do at least 150 minutes (2 hours and  30 minutes) a week of moderate-intensity, or 75 minutes (1 hour and 15 minutes) a week of vigorous-intensity aerobic physical activity, or an equivalent combination of moderate- and vigorous-intensity aerobic activity. Aerobic activity should be performed in episodes of at least 10 minutes, and preferably, it should be spread throughout the week.  Behavioral modification strategies: increasing lean protein intake.  Kelly Bruce has agreed to follow-up with our clinic in 3 weeks. She was informed of the importance of frequent follow-up visits to maximize her success with intensive lifestyle modifications for her multiple health conditions.   Kelly Bruce was informed we would discuss her lab results at her next visit unless there is a critical issue that needs to be addressed sooner. Kelly Bruce agreed to keep her next visit at the agreed upon time to discuss these results.  Objective:   Blood pressure 98/64, pulse (!) 55, temperature 97.9 F (36.6 C), temperature source Oral, height 5\' 4"  (1.626 m), weight 221 lb (100.2 kg), SpO2 97 %. Body mass index is 37.93 kg/m.  General: Cooperative, alert, well developed, in no acute distress. HEENT: Conjunctivae and lids unremarkable. Cardiovascular: Regular rhythm.  Lungs: Normal work of breathing. Neurologic: No focal deficits.   Lab Results  Component Value Date   CREATININE 0.65 02/11/2020   BUN 13 02/11/2020   NA 141 02/11/2020   K 4.2 02/11/2020   CL 103 02/11/2020   CO2 21 02/11/2020   Lab Results  Component Value Date   ALT 10 02/11/2020   AST 14 02/11/2020   ALKPHOS 70 02/11/2020   BILITOT 0.3 02/11/2020   Lab Results  Component Value Date   HGBA1C 5.7 (H) 02/11/2020   HGBA1C 5.7 (H) 08/29/2019   Lab Results  Component Value Date   INSULIN 13.2 02/11/2020   Lab Results  Component Value Date   TSH 1.780 02/11/2020   Lab Results  Component Value Date   CHOL 211 (H) 02/11/2020   HDL 54 02/11/2020   LDLCALC 137 (H) 02/11/2020    TRIG 111 02/11/2020   CHOLHDL 3.9 02/11/2020   Lab Results  Component Value Date   WBC 6.7 02/11/2020   HGB 14.0 02/11/2020   HCT 43.3 02/11/2020   MCV 79 02/11/2020   PLT 296 02/11/2020   Lab Results  Component Value Date   IRON 59 02/11/2020   TIBC 428 02/11/2020   FERRITIN 26 02/11/2020   Attestation Statements:   Reviewed by clinician on day of visit: allergies, medications, problem list, medical history, surgical history, family history, social history, and previous encounter notes.  I, Water quality scientist, CMA, am acting as transcriptionist for Briscoe Deutscher, DO  I have reviewed the above documentation for accuracy and completeness, and I agree with the above. Briscoe Deutscher, DO

## 2020-05-29 ENCOUNTER — Encounter: Payer: Self-pay | Admitting: Medical-Surgical

## 2020-05-29 ENCOUNTER — Ambulatory Visit (INDEPENDENT_AMBULATORY_CARE_PROVIDER_SITE_OTHER): Payer: BC Managed Care – PPO | Admitting: Medical-Surgical

## 2020-05-29 VITALS — BP 102/71 | HR 68 | Temp 98.3°F | Ht 64.0 in | Wt 225.6 lb

## 2020-05-29 DIAGNOSIS — N924 Excessive bleeding in the premenopausal period: Secondary | ICD-10-CM | POA: Diagnosis not present

## 2020-05-29 MED ORDER — MEDROXYPROGESTERONE ACETATE 10 MG PO TABS: 10.0000 mg | ORAL_TABLET | Freq: Every day | ORAL | 0 refills | Status: DC

## 2020-05-29 NOTE — Progress Notes (Signed)
Subjective:    CC: Prolonged heavy menstrual bleeding  HPI: Kelly 44 year old Bruce presenting for prolonged heavy menstrual bleeding. She started her menses on 6/22 with regular flow and symptoms. She has continued to have moderate flow until 7/5 when her bleeding became very heavy requiring pad changes every 1 hour. This heavy bleeding continue for approximately 48 hours then began to lessen. She is still experiencing bleeding requiring 4-5 pad changes per day. Prior to heavy bleeding she was using tampons but has only used pads since then. She has had mild abdominal cramping but no worse than her usual cycle symptoms. Occasionally sexually active. Not on birth control. History of cervical polyp requiring surgical removal in 2017. Denies fever, chills, nausea, diarrhea, dizziness, and severe fatigue.  I reviewed the past medical history, family history, social history, surgical history, and allergies today and no changes were needed.  Please see the problem list section below in epic for further details.  Past Medical History: Past Medical History:  Diagnosis Date  . Adenomatous colon polyp   . Allergy   . Anemia   . Erosive esophagitis   . Foot pain, left   . Gestational diabetes   . H/O shoulder surgery 08/19/2015  . Hemorrhoids   . Hiatal hernia 08/18/2015   EGD 06/09/14  . Iron deficiency anemia   . 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 Right knee pain   . Vaginal bleeding, abnormal    Past Surgical History: Past Surgical History:  Procedure Laterality Date  . CESAREAN SECTION    . CYST EXCISION Right 1996   wrist  . ESOPHAGEAL MANOMETRY N/A 02/22/2016   Procedure: ESOPHAGEAL MANOMETRY (EM);  Surgeon: Mauri Pole, MD;  Location: [facility];  Service: Endoscopy;  Laterality: N/A;  . NM RENAL LASIX ([place] HX)    . [place] IMPEDANCE STUDY N/A 02/22/2016   Procedure: [place] IMPEDANCE STUDY;  Surgeon: Mauri Pole, MD;  Location: [facility]  ENDOSCOPY;  Service: Endoscopy;  Laterality: N/A;  . PLANTAR FASCIA RELEASE Bilateral [id number]  . SHOULDER ARTHROSCOPY  2011   Social History: Social History   Socioeconomic History  . Marital status: Married    Spouse name: Simrat Kendrick  . Number of children: 0  . Years of education: 44  . Highest education level: Not on file  Occupational History  . Occupation: Professor of Civil engineer, contracting: [employer]  Tobacco Use  . Smoking status: Never Smoker  . Smokeless tobacco: Never Used  Substance and Sexual Activity  . Alcohol use: Yes    Alcohol/week: 1.0 standard drink    Types: 1 Standard drinks or equivalent per week  . Drug use: No  . Sexual activity: Not Currently    Partners: Male    Birth control/protection: None  Other Topics Concern  . Not on file  Social History Narrative  . Not on file   Social Determinants of Health   Financial Resource Strain:   . Difficulty of Paying Living Expenses:   Food Insecurity:   . Worried About Charity fundraiser in the Last Year:   . Arboriculturist in the Last Year:   Transportation Needs:   . Film/video editor (Medical):   Marland Kitchen Lack of Transportation (Non-Medical):   Physical Activity:   . Days of Exercise per Week:   . Minutes of Exercise per Session:   Stress:   . Feeling of Stress :  Social Connections:   . Frequency of Communication with Friends and Family:   . Frequency of Social Gatherings with Friends and Family:   . Attends Religious Services:   . Active Member of Clubs or Organizations:   . Attends Archivist Meetings:   Marland Kitchen Marital Status:    Family History: Family History  Problem Relation Age of Onset  . Depression Mother   . Mental illness Mother   . Bipolar disorder Mother   . Hypertension Mother   . Hyperlipidemia Mother   . Obesity Mother   . Heart attack Father   . Diabetes Father   . Factor V Leiden deficiency Father   . Hyperlipidemia Father   . Heart  disease Father   . Sleep apnea Father   . Obesity Father   . Stroke Maternal Grandmother   . Stroke Paternal Grandmother   . Heart attack Paternal Grandfather    Allergies: No Known Allergies Medications: See med rec.  Review of Systems: No fevers, chills, night sweats, weight loss, chest pain, or shortness of breath.   Objective:    General: Well Developed, well nourished, and in no acute distress.  Neuro: Alert and oriented x3.  HEENT: Normocephalic, atraumatic.  Skin: Warm and dry. Cardiac: Regular rate and rhythm, no murmurs rubs or gallops, no lower extremity edema.  Respiratory: Clear to auscultation bilaterally. Not using accessory muscles, speaking in full sentences. Abdomen: Soft, nondistended, nontender.  Pelvic exam: VULVA: normal appearing vulva with no masses, tenderness or lesions, VAGINA: vaginal discharge - bloody, normal without lesions, CERVIX: cervical discharge present - bloody, cervical motion tenderness absent, non-friable, no lesions or polyps visualized, exam chaperoned by Glennie Hawk, MA.    Impression and Recommendations:    1. Excessive bleeding in premenopausal period History of cervical polyp that was removed in 2014. Possible recurrence despite inability to visualize on speculum exam. Getting a TVUS to evaluate further. To help control bleeding in the meantime, sending in Provera 10mg  daily for 10 days. Further workup pending [place] results. Will be getting blood work drawn on Monday and would prefer to defer checking a CBC today as she has trouble with blood draws and vasovagal response. - US Pelvic Complete With Transvaginal; Future - medroxyPROGESTERone (PROVERA) 10 MG tablet; Take 1 tablet (10 mg total) by mouth daily for 10 days.  Dispense: 10 tablet; Refill: 0  Return if symptoms worsen or fail to improve.  ___________________________________________ Clearnce Sorrel, DNP, APRN, FNP-BC [facility] [place]

## 2020-05-31 ENCOUNTER — Other Ambulatory Visit (INDEPENDENT_AMBULATORY_CARE_PROVIDER_SITE_OTHER): Payer: Self-pay | Admitting: Family Medicine

## 2020-05-31 DIAGNOSIS — E559 Vitamin D deficiency, unspecified: Secondary | ICD-10-CM

## 2020-06-02 LAB — CBC WITH DIFFERENTIAL/PLATELET
Basophils Absolute: 0.1 10*3/uL (ref 0.0–0.2)
Basos: 1 %
EOS (ABSOLUTE): 0.1 10*3/uL (ref 0.0–0.4)
Eos: 1 %
Hemoglobin: 12.9 g/dL (ref 11.1–15.9)
Immature Grans (Abs): 0 10*3/uL (ref 0.0–0.1)
Immature Granulocytes: 0 %
Lymphocytes Absolute: 2.4 10*3/uL (ref 0.7–3.1)
Lymphs: 28 %
MCH: 25.9 pg — ABNORMAL LOW (ref 26.6–33.0)
MCHC: 32.3 g/dL (ref 31.5–35.7)
MCV: 80 fL (ref 79–97)
Monocytes Absolute: 0.6 10*3/uL (ref 0.1–0.9)
Monocytes: 7 %
Neutrophils Absolute: 5.5 10*3/uL (ref 1.4–7.0)
Neutrophils: 63 %
Platelets: 303 10*3/uL (ref 150–450)
RBC: 4.98 x10E6/uL (ref 3.77–5.28)
RDW: 14.3 % (ref 11.7–15.4)
WBC: 8.7 10*3/uL (ref 3.4–10.8)

## 2020-06-02 LAB — T4, FREE: Free T4: 1.1 ng/dL (ref 0.82–1.77)

## 2020-06-02 LAB — COMPREHENSIVE METABOLIC PANEL
ALT: 11 IU/L (ref 0–32)
AST: 10 IU/L (ref 0–40)
Albumin/Globulin Ratio: 1.9 (ref 1.2–2.2)
Albumin: 4.5 g/dL (ref 3.8–4.8)
Alkaline Phosphatase: 65 IU/L (ref 48–121)
BUN/Creatinine Ratio: 15 (ref 9–23)
BUN: 9 mg/dL (ref 6–24)
Bilirubin Total: 0.3 mg/dL (ref 0.0–1.2)
CO2: 23 mmol/L (ref 20–29)
Calcium: 9.4 mg/dL (ref 8.7–10.2)
Chloride: 103 mmol/L (ref 96–106)
Creatinine, Ser: 0.61 mg/dL (ref 0.57–1.00)
GFR calc Af Amer: 129 mL/min/{1.73_m2} (ref >59)
GFR calc non Af Amer: 112 mL/min/{1.73_m2} (ref >59)
Globulin, Total: 2.4 g/dL (ref 1.5–4.5)
Glucose: 74 mg/dL (ref 65–99)
Potassium: 3.9 mmol/L (ref 3.5–5.2)
Sodium: 140 mmol/L (ref 134–144)
Total Protein: 6.9 g/dL (ref 6.0–8.5)

## 2020-06-02 LAB — LIPID PANEL WITH LDL/HDL RATIO
Cholesterol, Total: 193 mg/dL (ref 100–199)
HDL: 53 mg/dL (ref >39)
LDL Chol Calc (NIH): 120 mg/dL — ABNORMAL HIGH (ref 0–99)
LDL/HDL Ratio: 2.3 ratio (ref 0.0–3.2)
Triglycerides: 110 mg/dL (ref 0–149)
VLDL Cholesterol Cal: 20 mg/dL (ref 5–40)

## 2020-06-02 LAB — ANEMIA PANEL
Ferritin: 28 ng/mL (ref 15–150)
Folate, Hemolysate: 418 ng/mL
Folate, RBC: 1045 ng/mL (ref >498)
Hematocrit: 40 % (ref 34.0–46.6)
Iron Saturation: 15 % (ref 15–55)
Iron: 57 ug/dL (ref 27–159)
Retic Ct Pct: 1.2 % (ref 0.6–2.6)
Total Iron Binding Capacity: 377 ug/dL (ref 250–450)
UIBC: 320 ug/dL (ref 131–425)
Vitamin B-12: 591 pg/mL (ref 232–1245)

## 2020-06-02 LAB — TSH: TSH: 2.34 u[IU]/mL (ref 0.450–4.500)

## 2020-06-02 LAB — VITAMIN D 25 HYDROXY (VIT D DEFICIENCY, FRACTURES): Vit D, 25-Hydroxy: 36 ng/mL (ref 30.0–100.0)

## 2020-06-02 LAB — HEMOGLOBIN A1C
Est. average glucose Bld gHb Est-mCnc: 114 mg/dL
Hgb A1c MFr Bld: 5.6 % (ref 4.8–5.6)

## 2020-06-02 LAB — T3: T3, Total: 103 ng/dL (ref 71–180)

## 2020-06-02 LAB — INSULIN, RANDOM: INSULIN: 13 u[IU]/mL (ref 2.6–24.9)

## 2020-06-10 ENCOUNTER — Ambulatory Visit (HOSPITAL_BASED_OUTPATIENT_CLINIC_OR_DEPARTMENT_OTHER): Payer: BC Managed Care – PPO

## 2020-07-14 ENCOUNTER — Other Ambulatory Visit: Payer: Self-pay | Admitting: Osteopathic Medicine

## 2020-07-14 DIAGNOSIS — K219 Gastro-esophageal reflux disease without esophagitis: Secondary | ICD-10-CM

## 2020-08-05 ENCOUNTER — Ambulatory Visit (INDEPENDENT_AMBULATORY_CARE_PROVIDER_SITE_OTHER): Payer: No Typology Code available for payment source | Admitting: Family Medicine

## 2020-08-05 ENCOUNTER — Encounter (INDEPENDENT_AMBULATORY_CARE_PROVIDER_SITE_OTHER): Payer: Self-pay | Admitting: Family Medicine

## 2020-08-05 ENCOUNTER — Other Ambulatory Visit: Payer: Self-pay

## 2020-08-05 VITALS — BP 109/74 | HR 62 | Temp 98.0°F | Ht 64.0 in | Wt 223.0 lb

## 2020-08-05 DIAGNOSIS — R7303 Prediabetes: Secondary | ICD-10-CM

## 2020-08-05 DIAGNOSIS — Z6838 Body mass index (BMI) 38.0-38.9, adult: Secondary | ICD-10-CM

## 2020-08-06 MED ORDER — METFORMIN HCL 500 MG PO TABS: 500.0000 mg | ORAL_TABLET | Freq: Every day | ORAL | 0 refills | Status: DC

## 2020-08-10 NOTE — Progress Notes (Signed)
Chief Complaint:   OBESITY Kelly Bruce is here to discuss her progress with her obesity treatment plan along with follow-up of her obesity related diagnoses. Kelly Bruce is on the Category 1 Plan and states she is following her eating plan approximately 30% of the time. Kelly Bruce states she is walking for 30 minutes 7 times per week.  Today's visit was #: 7 Starting weight: 242 lbs Starting date: 02/11/2020 Today's weight: 223 lbs Today's date: 08/05/2020 Total lbs lost to date: 19 lbs Total lbs lost since last in-office visit: 0 Total weight loss percentage to date: -7.85%  Interim History: Kelly Bruce says she is doing breakfast on plan.  She is skipping lunch or having carbs.  She says dinner is decent.  Assessment/Plan:   1. Prediabetes Generally at goal. Goal is HgbA1c < 5.7 and insulin level closer to 5. She will continue to focus on protein-rich, low simple carbohydrate foods. We reviewed the importance of hydration, regular exercise for stress reduction, and restorative sleep.   Lab Results  Component Value Date   HGBA1C 5.6 06/01/2020   Lab Results  Component Value Date   INSULIN 13.0 06/01/2020   INSULIN 13.2 02/11/2020   - Refill metFORMIN (GLUCOPHAGE) 500 MG tablet; Take 1 tablet (500 mg total) by mouth daily.  Dispense: 90 tablet; Refill: 0  2. Class 2 severe obesity with serious comorbidity and body mass index (BMI) of 38.0 to 38.9 in adult, unspecified obesity type (HCC) Kelly Bruce is currently in the action stage of change. As such, her goal is to continue with weight loss efforts. She has agreed to the Category 1 Plan.   Exercise goals: For substantial health benefits, adults should do at least 150 minutes (2 hours and 30 minutes) a week of moderate-intensity, or 75 minutes (1 hour and 15 minutes) a week of vigorous-intensity aerobic physical activity, or an equivalent combination of moderate- and vigorous-intensity aerobic activity. Aerobic activity should be performed in  episodes of at least 10 minutes, and preferably, it should be spread throughout the week.  Behavioral modification strategies: increasing lean protein intake and increasing water intake.  Focus on increasing protein at lunch.  Kelly Bruce has agreed to follow-up with our clinic in 2-3 weeks. She was informed of the importance of frequent follow-up visits to maximize her success with intensive lifestyle modifications for her multiple health conditions.   Objective:   Blood pressure 109/74, pulse 62, temperature 98 F (36.7 C), temperature source Oral, height 5\' 4"  (1.626 m), weight 223 lb (101.2 kg), SpO2 98 %. Body mass index is 38.28 kg/m.  General: Cooperative, alert, well developed, in no acute distress. HEENT: Conjunctivae and lids unremarkable. Cardiovascular: Regular rhythm.  Lungs: Normal work of breathing. Neurologic: No focal deficits.   Lab Results  Component Value Date   CREATININE 0.61 06/01/2020   BUN 9 06/01/2020   NA 140 06/01/2020   K 3.9 06/01/2020   CL 103 06/01/2020   CO2 23 06/01/2020   Lab Results  Component Value Date   ALT 11 06/01/2020   AST 10 06/01/2020   ALKPHOS 65 06/01/2020   BILITOT 0.3 06/01/2020   Lab Results  Component Value Date   HGBA1C 5.6 06/01/2020   HGBA1C 5.7 (H) 02/11/2020   HGBA1C 5.7 (H) 08/29/2019   Lab Results  Component Value Date   INSULIN 13.0 06/01/2020   INSULIN 13.2 02/11/2020   Lab Results  Component Value Date   TSH 2.340 06/01/2020   Lab Results  Component Value Date  CHOL 193 06/01/2020   HDL 53 06/01/2020   LDLCALC 120 (H) 06/01/2020   TRIG 110 06/01/2020   CHOLHDL 3.9 02/11/2020   Lab Results  Component Value Date   WBC 8.7 06/01/2020   HGB 12.9 06/01/2020   HCT 40.0 06/01/2020   MCV 80 06/01/2020   PLT 303 06/01/2020   Lab Results  Component Value Date   IRON 57 06/01/2020   TIBC 377 06/01/2020   FERRITIN 28 06/01/2020   Attestation Statements:   Reviewed by clinician on day of visit:  allergies, medications, problem list, medical history, surgical history, family history, social history, and previous encounter notes.  Time spent on visit including pre-visit chart review and post-visit care and charting was 25 minutes.   I, Water quality scientist, CMA, am acting as transcriptionist for Briscoe Deutscher, DO  I have reviewed the above documentation for accuracy and completeness, and I agree with the above. Briscoe Deutscher, DO

## 2020-08-24 ENCOUNTER — Ambulatory Visit: Payer: Self-pay

## 2020-08-24 ENCOUNTER — Ambulatory Visit (INDEPENDENT_AMBULATORY_CARE_PROVIDER_SITE_OTHER): Payer: PRIVATE HEALTH INSURANCE | Admitting: Family Medicine

## 2020-08-24 ENCOUNTER — Ambulatory Visit (INDEPENDENT_AMBULATORY_CARE_PROVIDER_SITE_OTHER): Payer: PRIVATE HEALTH INSURANCE

## 2020-08-24 ENCOUNTER — Other Ambulatory Visit: Payer: Self-pay

## 2020-08-24 ENCOUNTER — Encounter: Payer: Self-pay | Admitting: Family Medicine

## 2020-08-24 VITALS — BP 104/78 | HR 71 | Ht 64.0 in | Wt 228.6 lb

## 2020-08-24 DIAGNOSIS — M79671 Pain in right foot: Secondary | ICD-10-CM | POA: Diagnosis not present

## 2020-08-24 DIAGNOSIS — M79672 Pain in left foot: Secondary | ICD-10-CM

## 2020-08-24 NOTE — Progress Notes (Signed)
Subjective:    CC: B foot pain  I, Molly Weber, LAT, ATC, am serving as scribe for Dr. Lynne Leader.  HPI: Pt is a 44 y/o female presenting w/ c/o chronic B foot pain that began in 2002 w/ B plantar fasciitis and B PF releases in 2006.  She locates her pain to her B dorsal and plantar feet.  She is a professor at [employer].  Swelling: yes in her L dorsal foot Aggravating factors: at the end of the day after being on her feet for long periods of the day; Treatments tried: comfortable shoes  Pertinent review of Systems: No fevers or chills  Relevant historical information: History of plantar fascial release   Objective:    Vitals:   08/24/20 1330  BP: 104/78  Pulse: 71  SpO2: 97%   General: Well Developed, well nourished, and in no acute distress.   MSK: Left foot normal-appearing tender palpation mildly at dorsal midfoot.  Nontender otherwise normal foot and ankle motion and strength. Right foot normal-appearing Mildly tender palpation plantar metatarsal heads 2 3 and 4.  Otherwise nontender.  Normal foot and ankle motion and strength. Pulses capillary fill and sensation are intact bilaterally.  Lab and Radiology Results  X-ray images feet bilaterally obtained today personally and independently interpreted  Right foot: Minimal to mild midfoot DJD.  No acute fractures or significant malalignment.  Left foot: Accessory ossicle visible at the cuboid.  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 female with bilateral foot pain.  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  Right foot pain more due to metatarsalgia.  Fundamentally plan to improve with conservative management.   Will use scaphoid pads and metatarsal pads and/or improved after market insoles/over-the-counter orthotics.  Additionally will try Voltaren gel.  Check back in 4 to 6 weeks.  Consider injection or MRI or formal physical therapy at that point.Marland Kitchen  PDMP not reviewed this encounter. Orders Placed This Encounter  Procedures  . [place] LIMITED JOINT SPACE STRUCTURES LOW BILAT(NO LINKED CHARGES)    Order Specific Question:   Reason for Exam (SYMPTOM  OR DIAGNOSIS REQUIRED)    Answer:   B foot pain    Order Specific Question:   Preferred imaging location?    Answer:   [facility]  . DG Foot Complete Left    Standing Status:   Future    Standing Expiration Date:   08/24/2021    Order Specific Question:   Reason for Exam (SYMPTOM  OR DIAGNOSIS REQUIRED)    Answer:   eval foot pain    Order Specific Question:   Is patient pregnant?    Answer:   No    Order Specific Question:   Preferred imaging location?    Answer:   [facility]  . DG Foot Complete Right    Standing Status:   Future    Standing Expiration Date:   08/24/2021    Order Specific Question:   Reason for Exam (SYMPTOM  OR DIAGNOSIS REQUIRED)    Answer:   eval foot pain    Order Specific Question:   Is patient pregnant?    Answer:   No    Order Specific  Question:   Preferred imaging location?    Answer:   [facility]   No orders of the defined types were placed in this encounter.   Discussed warning signs or symptoms. Please see discharge instructions. Patient expresses understanding.   The above documentation has been reviewed and is accurate and complete Lynne Leader, M.D.

## 2020-08-24 NOTE — Patient Instructions (Addendum)
Thank you for coming in today.  Try voltaren gel up to 4x daily.   Try the metatarsal pads and scaphoid pads from [facility].   Consider over the counter insoles with metatarsal pads built in.   Recheck in 4-6 weeks.

## 2020-08-25 NOTE — Progress Notes (Signed)
X-ray right foot looks normal to radiology

## 2020-08-25 NOTE — Progress Notes (Signed)
X-ray left foot looks normal to radiology

## 2020-08-31 ENCOUNTER — Encounter (INDEPENDENT_AMBULATORY_CARE_PROVIDER_SITE_OTHER): Payer: Self-pay | Admitting: Family Medicine

## 2020-08-31 ENCOUNTER — Other Ambulatory Visit: Payer: Self-pay

## 2020-08-31 ENCOUNTER — Ambulatory Visit (INDEPENDENT_AMBULATORY_CARE_PROVIDER_SITE_OTHER): Payer: PRIVATE HEALTH INSURANCE | Admitting: Family Medicine

## 2020-08-31 VITALS — BP 103/68 | HR 51 | Temp 98.1°F | Ht 64.0 in | Wt 224.0 lb

## 2020-08-31 DIAGNOSIS — R7303 Prediabetes: Secondary | ICD-10-CM | POA: Diagnosis not present

## 2020-08-31 DIAGNOSIS — Z6838 Body mass index (BMI) 38.0-38.9, adult: Secondary | ICD-10-CM

## 2020-08-31 MED ORDER — METFORMIN HCL 500 MG PO TABS: 500.0000 mg | ORAL_TABLET | Freq: Two times a day (BID) | ORAL | 0 refills | Status: DC

## 2020-09-01 NOTE — Progress Notes (Signed)
Chief Complaint:   OBESITY Kelly Bruce is here to discuss her progress with her obesity treatment plan along with follow-up of her obesity related diagnoses. Kelly Bruce is on the Category 1 Plan and states she is following her eating plan approximately 80% of the time. Kelly Bruce states she is walking for 30 minutes 5 times per week.  Today's visit was #: 8 Starting weight: 242 lbs Starting date: 02/11/2020 Today's weight: 224 lbs Today's date: 08/31/2020 Total lbs lost to date: 18 lbs Total lbs lost since last in-office visit: +1 lb Total weight loss percentage to date: -7.44%  Interim History: Kelly Bruce says she has been snacking at night. Meals, exercise, stress, and sleep are generally stable otherwise.   Assessment/Plan:   1. Prediabetes Goal is HgbA1c < 5.7 and insulin level closer to 5.  Will start her on metformin 500 mg twice daily. She will continue to focus on protein-rich, low simple carbohydrate foods. We reviewed the importance of hydration, regular exercise for stress reduction, and restorative sleep.   Lab Results  Component Value Date   HGBA1C 5.6 06/01/2020   Lab Results  Component Value Date   INSULIN 13.0 06/01/2020   INSULIN 13.2 02/11/2020   - Start metFORMIN (GLUCOPHAGE) 500 MG tablet; Take 1 tablet (500 mg total) by mouth 2 (two) times daily with a meal.  Dispense: 60 tablet; Refill: 0  2. Class 2 severe obesity with serious comorbidity and body mass index (BMI) of 38.0 to 38.9 in adult, unspecified obesity type (HCC)  Kelly Bruce is currently in the action stage of change. As such, her goal is to continue with weight loss efforts. She has agreed to keeping a food journal and adhering to recommended goals of 1200-1300 calories and 95 grams of protein.   Exercise goals: For substantial health benefits, adults should do at least 150 minutes (2 hours and 30 minutes) a week of moderate-intensity, or 75 minutes (1 hour and 15 minutes) a week of vigorous-intensity aerobic  physical activity, or an equivalent combination of moderate- and vigorous-intensity aerobic activity. Aerobic activity should be performed in episodes of at least 10 minutes, and preferably, it should be spread throughout the week.  Behavioral modification strategies: increasing lean protein intake, decreasing simple carbohydrates and increasing vegetables.  Kelly Bruce has agreed to follow-up with our clinic in 3 weeks. She was informed of the importance of frequent follow-up visits to maximize her success with intensive lifestyle modifications for her multiple health conditions.   Objective:   Blood pressure 103/68, pulse (!) 51, temperature 98.1 F (36.7 C), temperature source Oral, height 5\' 4"  (1.626 m), weight 224 lb (101.6 kg), last menstrual period 08/10/2020, SpO2 99 %. Body mass index is 38.45 kg/m.  General: Cooperative, alert, well developed, in no acute distress. HEENT: Conjunctivae and lids unremarkable. Cardiovascular: Regular rhythm.  Lungs: Normal work of breathing. Neurologic: No focal deficits.   Lab Results  Component Value Date   CREATININE 0.61 06/01/2020   BUN 9 06/01/2020   NA 140 06/01/2020   K 3.9 06/01/2020   CL 103 06/01/2020   CO2 23 06/01/2020   Lab Results  Component Value Date   ALT 11 06/01/2020   AST 10 06/01/2020   ALKPHOS 65 06/01/2020   BILITOT 0.3 06/01/2020   Lab Results  Component Value Date   HGBA1C 5.6 06/01/2020   HGBA1C 5.7 (H) 02/11/2020   HGBA1C 5.7 (H) 08/29/2019   Lab Results  Component Value Date   INSULIN 13.0 06/01/2020   INSULIN  13.2 02/11/2020   Lab Results  Component Value Date   TSH 2.340 06/01/2020   Lab Results  Component Value Date   CHOL 193 06/01/2020   HDL 53 06/01/2020   LDLCALC 120 (H) 06/01/2020   TRIG 110 06/01/2020   CHOLHDL 3.9 02/11/2020   Lab Results  Component Value Date   WBC 8.7 06/01/2020   HGB 12.9 06/01/2020   HCT 40.0 06/01/2020   MCV 80 06/01/2020   PLT 303 06/01/2020   Lab  Results  Component Value Date   IRON 57 06/01/2020   TIBC 377 06/01/2020   FERRITIN 28 06/01/2020   Attestation Statements:   Reviewed by clinician on day of visit: allergies, medications, problem list, medical history, surgical history, family history, social history, and previous encounter notes.  Time spent on visit including pre-visit chart review and post-visit care and charting was 30 minutes.   I, Water quality scientist, CMA, am acting as transcriptionist for Briscoe Deutscher, DO  I have reviewed the above documentation for accuracy and completeness, and I agree with the above. Briscoe Deutscher, DO

## 2020-09-21 ENCOUNTER — Encounter: Payer: Self-pay | Admitting: Family Medicine

## 2020-09-21 ENCOUNTER — Ambulatory Visit: Payer: PRIVATE HEALTH INSURANCE | Admitting: Family Medicine

## 2020-09-21 ENCOUNTER — Other Ambulatory Visit: Payer: Self-pay

## 2020-09-21 VITALS — BP 102/70 | HR 75 | Ht 64.0 in | Wt 230.0 lb

## 2020-09-21 DIAGNOSIS — M79672 Pain in left foot: Secondary | ICD-10-CM | POA: Diagnosis not present

## 2020-09-21 DIAGNOSIS — M79671 Pain in right foot: Secondary | ICD-10-CM

## 2020-09-21 NOTE — Progress Notes (Signed)
   I, Wendy Poet, LAT, ATC, am serving as scribe for Dr. Lynne Leader.  Kelly Bruce is a 44 y.o. female who presents to [place] [facility] today for f/u c chronic bilat foot pn since 2002 and prior B plantar fascia release.  She was last seen by Dr. Georgina Snell on 08/24/20 and was advised to use MT and scaphoid pads and Voltaren gel.  Since her last visit, pt reports that her B foot pain is better but still has some.  She rates her improvement at 50%.  She has been using the Hapad inserts but is not sure if she has the MT pads located correctly in shoe inserts.  She has been using the Voltaren gel.  Diagnostic imaging: R and L foot XR- 08/24/20  Pertinent review of systems:  No fevers or chills  Relevant historical information: History of plantar fasciitis with plantar fascial release bilaterally.  Hiatal hernia.   Exam:  BP 102/70 (BP Location: Left Arm, Patient Position: Sitting, Cuff Size: Large)   Pulse 75   Ht 5\' 4"  (1.626 m)   Wt 230 lb (104.3 kg)   SpO2 97%   BMI 39.48 kg/m  General: Well Developed, well nourished, and in no acute distress.   MSK: Left foot normal-appearing no particular tender palpation normal motion.    Lab and Radiology Results DG Foot Complete Left  Result Date: 08/25/2020 CLINICAL DATA:  Chronic pain. History of stress fracture and bilateral plantar fascia release. EXAM: LEFT FOOT - COMPLETE 3+ VIEW COMPARISON:  None. FINDINGS: There is no evidence of fracture or dislocation. There is no evidence of arthropathy or other focal bone abnormality. Soft tissues are unremarkable. IMPRESSION: Negative. Electronically Signed   By: Misty Stanley M.D.   On: 08/25/2020 08:06   DG Foot Complete Right  Result Date: 08/25/2020 CLINICAL DATA:  Foot pain.  History of plantar fascia release. EXAM: RIGHT FOOT COMPLETE - 3+ VIEW COMPARISON:  None. FINDINGS: There is no evidence of fracture or dislocation. There is no evidence of arthropathy or other  focal bone abnormality. Soft tissues are unremarkable. IMPRESSION: Negative. Electronically Signed   By: Misty Stanley M.D.   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 No fx or sig DJD    Assessment and Plan: 44 y.o. female with foot pain.  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  Improving with simple conservative management Voltaren gel metatarsal pads and scaphoid pads.  Patient also found her old three-quarter length custom orthotics with metatarsal pads.  She is using those occasionally in her footwear as well.  Plan to continue current regimen.  Also consider adding arch straps.  Discussed next steps options including injection and/or MRI.  Plan to continue conservative management.  Recheck back with me as needed.     Discussed warning signs or symptoms. Please see discharge instructions. Patient expresses understanding.   The above documentation has been reviewed and is accurate and complete Lynne Leader, M.D.  Total encounter time 20 minutes including face-to-face time with the patient and, reviewing past medical record, and charting on the date of service.   Options and next steps.

## 2020-09-21 NOTE — Patient Instructions (Signed)
Thank you for coming in today.  Continue the orthotics Continue voltaren gel.  Consider arch straps or arch binder.   Recheck with me as needed.  Could consider injection or even MRI in the future.

## 2020-09-28 ENCOUNTER — Ambulatory Visit (INDEPENDENT_AMBULATORY_CARE_PROVIDER_SITE_OTHER): Payer: PRIVATE HEALTH INSURANCE | Admitting: Family Medicine

## 2020-09-28 ENCOUNTER — Encounter (INDEPENDENT_AMBULATORY_CARE_PROVIDER_SITE_OTHER): Payer: Self-pay | Admitting: Family Medicine

## 2020-09-28 ENCOUNTER — Other Ambulatory Visit: Payer: Self-pay

## 2020-09-28 VITALS — BP 106/69 | HR 62 | Temp 97.8°F | Ht 64.0 in | Wt 222.0 lb

## 2020-09-28 DIAGNOSIS — M79671 Pain in right foot: Secondary | ICD-10-CM

## 2020-09-28 DIAGNOSIS — K219 Gastro-esophageal reflux disease without esophagitis: Secondary | ICD-10-CM | POA: Diagnosis not present

## 2020-09-28 DIAGNOSIS — F3289 Other specified depressive episodes: Secondary | ICD-10-CM

## 2020-09-28 DIAGNOSIS — M79672 Pain in left foot: Secondary | ICD-10-CM

## 2020-09-28 DIAGNOSIS — R7303 Prediabetes: Secondary | ICD-10-CM

## 2020-09-28 DIAGNOSIS — Z6838 Body mass index (BMI) 38.0-38.9, adult: Secondary | ICD-10-CM

## 2020-09-29 NOTE — Progress Notes (Signed)
Chief Complaint:   OBESITY Kelly Bruce is here to discuss her progress with her obesity treatment plan along with follow-up of her obesity related diagnoses.   Today's visit was #: 9 Starting weight: 242 lbs Starting date: 02/11/2020 Today's weight: 222 lbs Today's date: 09/28/2020 Total lbs lost to date: 20 lbs Body mass index is 38.11 kg/m.  Total weight loss percentage to date: -8.26%  Interim History: Kelly Bruce says her night snacking has improved with increased dose of metformin.   Nutrition Plan: the Category 1 Plan for 85% of the time.  Anti-obesity medications: metformin 500 mg twice daily. Reported side effects: None. Hunger is well controlled controlled. Cravings are well controlled controlled.  Activity: Walking for 20-30 minutes 5 times per week.  Assessment/Plan:   1. Prediabetes At goal. Goal is HgbA1c < 5.7.  Medication: metformin 500 mg twice daily.  She will continue to focus on protein-rich, low simple carbohydrate foods. We reviewed the importance of hydration, regular exercise for stress reduction, and restorative sleep.   Lab Results  Component Value Date   HGBA1C 5.6 06/01/2020   Lab Results  Component Value Date   INSULIN 13.0 06/01/2020   INSULIN 13.2 02/11/2020   2. Gastroesophageal reflux disease She is taking omeprazole, anticipates a new EGD. We will continue to monitor symptoms as they relate to her weight loss journey. This issue directly impacts care plan for optimization of BMI and metabolic health as it impacts the patient's ability to make lifestyle changes.  We reviewed the diagnosis of GERD and the reasons why it was important to treat. We discussed "red flag" symptoms and the importance of follow up if symptoms persisted despite treatment. We reviewed non-pharmacologic management of GERD symptoms: including: caffeine reduction, dietary changes, elevate HOB, NPO after supper, reduction of alcohol intake, tobacco cessation, and weight  loss.  3. Bilateral foot pain She is treated by Dr. Georgina Snell of Sports Medicine.  Note reviewed. We will continue to monitor symptoms as they relate to her weight loss journey.  4. Other depression, with emotional eating Behavior modification techniques were discussed today to help Kelly Bruce deal with her emotional/non-hunger eating behaviors.    5. Class 2 severe obesity with serious comorbidity and body mass index (BMI) of 38.0 to 38.9 in adult, unspecified obesity type ([place])  Course: Kelly Bruce is currently in the action stage of change. As such, her goal is to continue with weight loss efforts.   Nutrition goals: She has agreed to the Category 1 Plan.   Exercise goals: For substantial health benefits, adults should do at least 150 minutes (2 hours and 30 minutes) a week of moderate-intensity, or 75 minutes (1 hour and 15 minutes) a week of vigorous-intensity aerobic physical activity, or an equivalent combination of moderate- and vigorous-intensity aerobic activity. Aerobic activity should be performed in episodes of at least 10 minutes, and preferably, it should be spread throughout the week.  Behavioral modification strategies: increasing lean protein intake, decreasing simple carbohydrates, increasing vegetables and increasing water intake.  Kelly Bruce has agreed to follow-up with our clinic in 3-4 weeks. She was informed of the importance of frequent follow-up visits to maximize her success with intensive lifestyle modifications for her multiple health conditions.   Objective:   Blood pressure 106/69, pulse 62, temperature 97.8 F (36.6 C), temperature source Oral, height 5\' 4"  (1.626 m), weight 222 lb (100.7 kg), SpO2 98 %. Body mass index is 38.11 kg/m.  General: Cooperative, alert, well developed, in no acute distress. HEENT:  Conjunctivae and lids unremarkable. Cardiovascular: Regular rhythm.  Lungs: Normal work of breathing. Neurologic: No focal deficits.   Lab Results  Component  Value Date   CREATININE 0.61 06/01/2020   BUN 9 06/01/2020   NA 140 06/01/2020   K 3.9 06/01/2020   CL 103 06/01/2020   CO2 23 06/01/2020   Lab Results  Component Value Date   ALT 11 06/01/2020   AST 10 06/01/2020   ALKPHOS 65 06/01/2020   BILITOT 0.3 06/01/2020   Lab Results  Component Value Date   HGBA1C 5.6 06/01/2020   HGBA1C 5.7 (H) 02/11/2020   HGBA1C 5.7 (H) 08/29/2019   Lab Results  Component Value Date   INSULIN 13.0 06/01/2020   INSULIN 13.2 02/11/2020   Lab Results  Component Value Date   TSH 2.340 06/01/2020   Lab Results  Component Value Date   CHOL 193 06/01/2020   HDL 53 06/01/2020   LDLCALC 120 (H) 06/01/2020   TRIG 110 06/01/2020   CHOLHDL 3.9 02/11/2020   Lab Results  Component Value Date   WBC 8.7 06/01/2020   HGB 12.9 06/01/2020   HCT 40.0 06/01/2020   MCV 80 06/01/2020   PLT 303 06/01/2020   Lab Results  Component Value Date   IRON 57 06/01/2020   TIBC 377 06/01/2020   FERRITIN 28 06/01/2020   Attestation Statements:   Reviewed by clinician on day of visit: allergies, medications, problem list, medical history, surgical history, family history, social history, and previous encounter notes.  I, Water quality scientist, CMA, am acting as transcriptionist for Briscoe Deutscher, DO  I have reviewed the above documentation for accuracy and completeness, and I agree with the above. Briscoe Deutscher, DO

## 2020-10-01 ENCOUNTER — Encounter (INDEPENDENT_AMBULATORY_CARE_PROVIDER_SITE_OTHER): Payer: Self-pay

## 2020-10-07 ENCOUNTER — Other Ambulatory Visit (INDEPENDENT_AMBULATORY_CARE_PROVIDER_SITE_OTHER): Payer: Self-pay | Admitting: Family Medicine

## 2020-10-07 DIAGNOSIS — R7303 Prediabetes: Secondary | ICD-10-CM

## 2020-10-07 NOTE — Telephone Encounter (Signed)
Last seen by Dr. Wallace. 

## 2020-10-21 ENCOUNTER — Ambulatory Visit (INDEPENDENT_AMBULATORY_CARE_PROVIDER_SITE_OTHER): Payer: PRIVATE HEALTH INSURANCE | Admitting: Family Medicine

## 2020-10-26 ENCOUNTER — Ambulatory Visit (INDEPENDENT_AMBULATORY_CARE_PROVIDER_SITE_OTHER): Payer: PRIVATE HEALTH INSURANCE | Admitting: Family Medicine

## 2020-10-26 ENCOUNTER — Other Ambulatory Visit: Payer: Self-pay

## 2020-10-26 ENCOUNTER — Encounter (INDEPENDENT_AMBULATORY_CARE_PROVIDER_SITE_OTHER): Payer: Self-pay | Admitting: Family Medicine

## 2020-10-26 VITALS — BP 108/67 | HR 63 | Temp 97.8°F | Ht 64.0 in | Wt 224.0 lb

## 2020-10-26 DIAGNOSIS — Z9189 Other specified personal risk factors, not elsewhere classified: Secondary | ICD-10-CM

## 2020-10-26 DIAGNOSIS — R632 Polyphagia: Secondary | ICD-10-CM | POA: Diagnosis not present

## 2020-10-26 DIAGNOSIS — Z6838 Body mass index (BMI) 38.0-38.9, adult: Secondary | ICD-10-CM

## 2020-10-26 MED ORDER — PHENTERMINE HCL 37.5 MG PO TABS: ORAL_TABLET | ORAL | 0 refills | Status: DC

## 2020-10-27 NOTE — Progress Notes (Signed)
Chief Complaint:   OBESITY Kelly Bruce is here to discuss her progress with her obesity treatment plan along with follow-up of her obesity related diagnoses.   Today's visit was #: 10 Starting weight: 242 lbs Starting date: 02/11/2020 Today's weight: 224 lbs Today's date: 10/26/2020 Total lbs lost to date: 18 lbs Body mass index is 38.45 kg/m.  Total weight loss percentage to date: -7.44%  Interim History: Kelly Bruce has gained 2 pounds since her last visit.  She feels that this was due to Thanksgiving meals.  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  She admits to being very busy and skipping meals on occasion.  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  She does recognize that this inhibits her sleep.  She endorses worsening polyphasia and cravings. Nutrition Plan: the Category 1 Plan for 75% of the time.  Anti-obesity medications: Metformin. Reported side effects: None. Hunger is poorly controlled. Cravings are poorly controlled . Activity: Walking for 20-30 minutes 5 times per week. Sleep: Sleep is not restful.   Assessment/Plan:   1. Polyphagia Controlled. She will continue to focus on protein-rich, low simple carbohydrate foods. We reviewed the importance of hydration, regular exercise for stress reduction, and restorative sleep.  - Refill phentermine (ADIPEX-P) 37.5 MG tablet; 1/2 to 1 tab daily  Dispense: 30 tablet; Refill: 0  This patient 1) has no evidence of serious cardiovascular disease; 2) does not have serious psychiatric disease or a history of substance abuse; 3) has been informed about weight loss medications that are FDA-approved for long term use and told that these have been documented to be safe and effective; 4) does not demonstrate a clinically significant increase in pulse or BP when taking phentermine; and 5) demonstrates significant weight loss while using the medication. Patient understands that all anti-obesity  medications are contraindicated in pregnancy. Pt denies a history of glaucoma.   Patient understands that long-term use of phentermine is considered off-label use of this medication, however, that the Endocrine Society and recent research supports that long-term use of phentermine does not appear to have detrimental health effects when used in the appropriate patient. In addition, a 2019 study published in Obesity Journal on [id number] patients concluded that "recommendations to limit phentermine to less than 3 months do not align with current concepts of pharmacologic treatment of obesity", and that "long term phentermine users experience greater weight loss without apparent increases in cardiovascular risk".  The potential risks and benefits of phentermine have been reviewed with the patient, and alternative treatment options were discussed. All questions were answered, and the patient wishes to move forward with this medication.  I have consulted the [place] Controlled Substances Registry for this patient, and feel the risk/benefit ratio today is favorable for proceeding with this prescription for a controlled substance. The patient understands monitoring parameters and red flags.   2. At risk for constipation Counseling Getting to Good Bowel Health: Your goal is to have one soft bowel movement each day. Drink at least 8 glasses of water each day. Eat plenty of fiber (goal is over 25 grams each day). It is best to get most of your fiber from dietary sources which includes leafy green vegetables, fresh fruit, and whole grains. You may need to add fiber with the help of OTC fiber supplements. These include Metamucil, Citrucel, and Benefiber. If you are still having trouble, try adding Miralax or Magnesium Citrate. If all of these changes  do not work, contact me.  3. Class 2 severe obesity with serious comorbidity and body mass index (BMI) of 38.0 to 38.9 in adult, unspecified obesity type ([place])  Course:  Kelly Bruce is currently in the action stage of change. As such, her goal is to continue with weight loss efforts.   Nutrition goals: She has agreed to the Category 1 Plan.   Exercise goals: For substantial health benefits, adults should do at least 150 minutes (2 hours and 30 minutes) a week of moderate-intensity, or 75 minutes (1 hour and 15 minutes) a week of vigorous-intensity aerobic physical activity, or an equivalent combination of moderate- and vigorous-intensity aerobic activity. Aerobic activity should be performed in episodes of at least 10 minutes, and preferably, it should be spread throughout the week.  Behavioral modification strategies: increasing lean protein intake, decreasing simple carbohydrates, increasing vegetables and increasing water intake.  Kelly Bruce has agreed to follow-up with our clinic in 4 weeks. She was informed of the importance of frequent follow-up visits to maximize her success with intensive lifestyle modifications for her multiple health conditions.   Objective:   Blood pressure 108/67, pulse 63, temperature 97.8 F (36.6 C), temperature source Oral, height 5\' 4"  (1.626 m), weight 224 lb (101.6 kg), SpO2 98 %. Body mass index is 38.45 kg/m.  General: Cooperative, alert, well developed, in no acute distress. HEENT: Conjunctivae and lids unremarkable. Cardiovascular: Regular rhythm.  Lungs: Normal work of breathing. Neurologic: No focal deficits.   Lab Results  Component Value Date   CREATININE 0.61 06/01/2020   BUN 9 06/01/2020   NA 140 06/01/2020   K 3.9 06/01/2020   CL 103 06/01/2020   CO2 23 06/01/2020   Lab Results  Component Value Date   ALT 11 06/01/2020   AST 10 06/01/2020   ALKPHOS 65 06/01/2020   BILITOT 0.3 06/01/2020   Lab Results  Component Value Date   HGBA1C 5.6 06/01/2020   HGBA1C 5.7 (H) 02/11/2020   HGBA1C 5.7 (H) 08/29/2019   Lab Results  Component Value Date   INSULIN 13.0 06/01/2020   INSULIN 13.2 02/11/2020   Lab  Results  Component Value Date   TSH 2.340 06/01/2020   Lab Results  Component Value Date   CHOL 193 06/01/2020   HDL 53 06/01/2020   LDLCALC 120 (H) 06/01/2020   TRIG 110 06/01/2020   CHOLHDL 3.9 02/11/2020   Lab Results  Component Value Date   WBC 8.7 06/01/2020   HGB 12.9 06/01/2020   HCT 40.0 06/01/2020   MCV 80 06/01/2020   PLT 303 06/01/2020   Lab Results  Component Value Date   IRON 57 06/01/2020   TIBC 377 06/01/2020   FERRITIN 28 06/01/2020   Attestation Statements:   Reviewed by clinician on day of visit: allergies, medications, problem list, medical history, surgical history, family history, social history, and previous encounter notes.  I, Water quality scientist, CMA, am acting as transcriptionist for Briscoe Deutscher, DO  I have reviewed the above documentation for accuracy and completeness, and I agree with the above. Briscoe Deutscher, DO

## 2020-11-25 ENCOUNTER — Ambulatory Visit (INDEPENDENT_AMBULATORY_CARE_PROVIDER_SITE_OTHER): Payer: PRIVATE HEALTH INSURANCE | Admitting: Family Medicine

## 2020-11-25 ENCOUNTER — Other Ambulatory Visit: Payer: Self-pay

## 2020-11-25 ENCOUNTER — Encounter (INDEPENDENT_AMBULATORY_CARE_PROVIDER_SITE_OTHER): Payer: Self-pay | Admitting: Family Medicine

## 2020-11-25 VITALS — BP 111/74 | HR 65 | Temp 98.0°F | Ht 64.0 in | Wt 224.0 lb

## 2020-11-25 DIAGNOSIS — Z9189 Other specified personal risk factors, not elsewhere classified: Secondary | ICD-10-CM | POA: Diagnosis not present

## 2020-11-25 DIAGNOSIS — R7303 Prediabetes: Secondary | ICD-10-CM | POA: Diagnosis not present

## 2020-11-25 DIAGNOSIS — E559 Vitamin D deficiency, unspecified: Secondary | ICD-10-CM

## 2020-11-25 DIAGNOSIS — R632 Polyphagia: Secondary | ICD-10-CM | POA: Diagnosis not present

## 2020-11-25 DIAGNOSIS — Z6838 Body mass index (BMI) 38.0-38.9, adult: Secondary | ICD-10-CM

## 2020-11-26 MED ORDER — PHENTERMINE HCL 37.5 MG PO TABS
ORAL_TABLET | ORAL | 0 refills | Status: DC
Start: 2020-11-26 — End: 2021-01-06

## 2020-11-26 MED ORDER — METFORMIN HCL 500 MG PO TABS: 500.0000 mg | ORAL_TABLET | Freq: Two times a day (BID) | ORAL | 0 refills | Status: DC

## 2020-11-26 NOTE — Progress Notes (Signed)
Chief Complaint:   OBESITY Kelly Bruce is here to discuss her progress with her obesity treatment plan along with follow-up of her obesity related diagnoses.   Today's visit was #: 11 Starting weight: 242 lbs Starting date: 02/11/2020 Today's weight: 224 lbs Today's date: 11/25/2020 Total lbs lost to date: 18 lbs Body mass index is 38.45 kg/m.  Total weight loss percentage to date: -7.44%  Interim History: Kelly Bruce says that school starts on Monday.  She reports that phentermine is helping to control hunger and cravings. Nutrition Plan: the Category 1 Plan for 75% of the time.  Anti-obesity medications: phentermine 18.75-37.5 mg daily. Reported side effects: None. Hunger is well controlled. Cravings are well controlled.  Activity: Walking for 20 minutes 3 times per week.  Assessment/Plan:   1. Polyphagia Improved.  She will continue to focus on protein-rich, low simple carbohydrate foods. We reviewed the importance of hydration, regular exercise for stress reduction, and restorative sleep.  - Refill phentermine (ADIPEX-P) 37.5 MG tablet; 1/2 to 1 tab daily  Dispense: 30 tablet; Refill: 0  I have consulted the [place] Controlled Substances Registry for this patient, and feel the risk/benefit ratio today is favorable for proceeding with this prescription for a controlled substance. The patient understands monitoring parameters and red flags.   The potential risks and benefits of phentermine have been reviewed with the patient, and alternative treatment options were discussed. All questions were answered, and the patient wishes to continue with this medication.  2. Prediabetes At goal. Goal is HgbA1c < 5.7.  Medication: metformin 500 mg twice daily.  She will continue to focus on protein-rich, low simple carbohydrate foods. We reviewed the importance of hydration, regular exercise for stress reduction, and restorative sleep.   Lab Results  Component Value Date   HGBA1C 5.6 06/01/2020    Lab Results  Component Value Date   INSULIN 13.0 06/01/2020   INSULIN 13.2 02/11/2020   - Refill metFORMIN (GLUCOPHAGE) 500 MG tablet; Take 1 tablet (500 mg total) by mouth 2 (two) times daily with a meal.  Dispense: 60 tablet; Refill: 0  3. Vitamin D deficiency Not at goal. Current vitamin D is 36.0, tested on 06/01/2020. Optimal goal > 50 ng/dL.   Plan:  []   Continue Vitamin D @50 ,000 IU every week. []   Continue home supplement daily. [x]   Follow-up for routine testing of Vitamin D at least 2-3 times per year to avoid over-replacement.  4. At risk for anxiety Kelly Bruce was given approximately 9 minutes of anxiety risk counseling today. She has risk factors for anxiety including an increase in schedule stressors. We discussed the importance of a healthy work life balance, a healthy relationship with food and a good support system.  5. Class 2 severe obesity with serious comorbidity and body mass index (BMI) of 38.0 to 38.9 in adult, unspecified obesity type ([place])  Course: Kelly Bruce is currently in the action stage of change. As such, her goal is to continue with weight loss efforts.   Nutrition goals: She has agreed to the Category 1 Plan.   Exercise goals: For substantial health benefits, adults should do at least 150 minutes (2 hours and 30 minutes) a week of moderate-intensity, or 75 minutes (1 hour and 15 minutes) a week of vigorous-intensity aerobic physical activity, or an equivalent combination of moderate- and vigorous-intensity aerobic activity. Aerobic activity should be performed in episodes of at least 10 minutes, and preferably, it should be spread throughout the week.  Behavioral modification strategies: increasing  lean protein intake, decreasing simple carbohydrates, increasing vegetables, increasing water intake, decreasing liquid calories and decreasing alcohol intake.  Kelly Bruce has agreed to follow-up with our clinic in 4 weeks. She was informed of the importance of  frequent follow-up visits to maximize her success with intensive lifestyle modifications for her multiple health conditions.   Objective:   Blood pressure 111/74, pulse 65, temperature 98 F (36.7 C), temperature source Oral, height 5\' 4"  (1.626 m), weight 224 lb (101.6 kg), SpO2 98 %. Body mass index is 38.45 kg/m.  General: Cooperative, alert, well developed, in no acute distress. HEENT: Conjunctivae and lids unremarkable. Cardiovascular: Regular rhythm.  Lungs: Normal work of breathing. Neurologic: No focal deficits.   Lab Results  Component Value Date   CREATININE 0.61 06/01/2020   BUN 9 06/01/2020   NA 140 06/01/2020   K 3.9 06/01/2020   CL 103 06/01/2020   CO2 23 06/01/2020   Lab Results  Component Value Date   ALT 11 06/01/2020   AST 10 06/01/2020   ALKPHOS 65 06/01/2020   BILITOT 0.3 06/01/2020   Lab Results  Component Value Date   HGBA1C 5.6 06/01/2020   HGBA1C 5.7 (H) 02/11/2020   HGBA1C 5.7 (H) 08/29/2019   Lab Results  Component Value Date   INSULIN 13.0 06/01/2020   INSULIN 13.2 02/11/2020   Lab Results  Component Value Date   TSH 2.340 06/01/2020   Lab Results  Component Value Date   CHOL 193 06/01/2020   HDL 53 06/01/2020   LDLCALC 120 (H) 06/01/2020   TRIG 110 06/01/2020   CHOLHDL 3.9 02/11/2020   Lab Results  Component Value Date   WBC 8.7 06/01/2020   HGB 12.9 06/01/2020   HCT 40.0 06/01/2020   MCV 80 06/01/2020   PLT 303 06/01/2020   Lab Results  Component Value Date   IRON 57 06/01/2020   TIBC 377 06/01/2020   FERRITIN 28 06/01/2020   Attestation Statements:   Reviewed by clinician on day of visit: allergies, medications, problem list, medical history, surgical history, family history, social history, and previous encounter notes.  I, 08/02/2020, CMA, am acting as transcriptionist for Insurance claims handler, DO  I have reviewed the above documentation for accuracy and completeness, and I agree with the above. Helane Rima, DO

## 2020-12-15 ENCOUNTER — Other Ambulatory Visit: Payer: Self-pay

## 2020-12-15 ENCOUNTER — Encounter (INDEPENDENT_AMBULATORY_CARE_PROVIDER_SITE_OTHER): Payer: Self-pay | Admitting: Family Medicine

## 2020-12-15 ENCOUNTER — Ambulatory Visit (INDEPENDENT_AMBULATORY_CARE_PROVIDER_SITE_OTHER): Payer: PRIVATE HEALTH INSURANCE | Admitting: Family Medicine

## 2020-12-15 VITALS — BP 110/73 | HR 76 | Temp 98.1°F | Ht 64.0 in | Wt 224.0 lb

## 2020-12-15 DIAGNOSIS — K219 Gastro-esophageal reflux disease without esophagitis: Secondary | ICD-10-CM | POA: Diagnosis not present

## 2020-12-15 DIAGNOSIS — Z6838 Body mass index (BMI) 38.0-38.9, adult: Secondary | ICD-10-CM

## 2020-12-15 DIAGNOSIS — E8881 Metabolic syndrome: Secondary | ICD-10-CM

## 2020-12-15 DIAGNOSIS — R632 Polyphagia: Secondary | ICD-10-CM

## 2020-12-15 DIAGNOSIS — Z9189 Other specified personal risk factors, not elsewhere classified: Secondary | ICD-10-CM | POA: Diagnosis not present

## 2020-12-16 NOTE — Progress Notes (Signed)
Chief Complaint:   OBESITY Kelly Bruce is here to discuss her progress with her obesity treatment plan along with follow-up of her obesity related diagnoses.   Today's visit was #: 12 Starting weight: 242 lbs Starting date: 02/11/2020 Today's weight: 224 lbs Today's date: 12/15/2020 Total lbs lost to date: 18 lbs Body mass index is 38.45 kg/m.  Total weight loss percentage to date: -7.44%  Interim History: Kelly Bruce says she has been increasing her exercise.  Body composition changes include increased muscle and decreased fat. Nutrition Plan: the Category 1 Plan.  Anti-obesity medications: phentermine. Reported side effects: None. Activity: Walking 7,000 steps 5 times per week.  Assessment/Plan:   1. Polyphagia Improving. Kelly Bruce is taking phentermine 37.5 mg 1/2-1 tablet daily.  Hyperphagia, also called polyphagia, refers to excessive feelings of hunger. This is more likely to be an issues for people that have diabetes, prediabetes, or insulin resistance. She will continue to focus on protein-rich, low simple carbohydrate foods. We reviewed the importance of hydration, regular exercise for stress reduction, and restorative sleep.  2. Gastroesophageal reflux disease Kelly Bruce is taking Prilosec 20 mg daily.  We reviewed the diagnosis of GERD and the reasons why it was important to treat. We discussed "red flag" symptoms and the importance of follow up if symptoms persisted despite treatment. We reviewed non-pharmacologic management of GERD symptoms: including: caffeine reduction, dietary changes, elevate HOB, NPO after supper, reduction of alcohol intake, tobacco cessation, and weight loss.  3. Insulin resistance Improving, but not optimized. Goal is HgbA1c < 5.7, fasting insulin closer to 5.  Medication: metformin 500 mg twice daily.  She will continue to focus on protein-rich, low simple carbohydrate foods. We reviewed the importance of hydration, regular exercise for stress reduction,  and restorative sleep.   Lab Results  Component Value Date   HGBA1C 5.6 06/01/2020   Lab Results  Component Value Date   INSULIN 13.0 06/01/2020   INSULIN 13.2 02/11/2020   4. At risk for heart disease Due to [place] current state of health and medical condition(s), she is at a higher risk for heart disease.   This puts the patient at much greater risk to subsequently develop cardiopulmonary conditions that can significantly affect patient's quality of life in a negative manner as well.    At least 9 minutes was spent on counseling Kelly Bruce about these concerns today. Initial goal is to lose at least 5-10% of starting weight to help reduce these risk factors.  We will continue to reassess these conditions on a fairly regular basis in an attempt to decrease patient's overall morbidity and mortality.  Evidence-based interventions for health behavior change were utilized today including the discussion of self monitoring techniques, problem-solving barriers and SMART goal setting techniques.  Specifically regarding patient's less desirable eating habits and patterns, we employed the technique of small changes when Kelly Bruce has not been able to fully commit to her prudent nutritional plan.  5. Class 2 severe obesity with serious comorbidity and body mass index (BMI) of 38.0 to 38.9 in adult, unspecified obesity type ([place])  Course: Kelly Bruce is currently in the action stage of change. As such, her goal is to continue with weight loss efforts.   Nutrition goals: She has agreed to the Category 1 Plan.   Exercise goals: Add strength training 2 times per week.  Behavioral modification strategies: increasing lean protein intake, decreasing simple carbohydrates, increasing vegetables and increasing water intake.  Kelly Bruce has agreed to follow-up with our clinic in 3 weeks.  She was informed of the importance of frequent follow-up visits to maximize her success with intensive lifestyle modifications for  her multiple health conditions.   Objective:   Blood pressure 110/73, pulse 76, temperature 98.1 F (36.7 C), temperature source Oral, height 5\' 4"  (1.626 m), weight 224 lb (101.6 kg), SpO2 96 %. Body mass index is 38.45 kg/m.  General: Cooperative, alert, well developed, in no acute distress. HEENT: Conjunctivae and lids unremarkable. Cardiovascular: Regular rhythm.  Lungs: Normal work of breathing. Neurologic: No focal deficits.   Lab Results  Component Value Date   CREATININE 0.61 06/01/2020   BUN 9 06/01/2020   NA 140 06/01/2020   K 3.9 06/01/2020   CL 103 06/01/2020   CO2 23 06/01/2020   Lab Results  Component Value Date   ALT 11 06/01/2020   AST 10 06/01/2020   ALKPHOS 65 06/01/2020   BILITOT 0.3 06/01/2020   Lab Results  Component Value Date   HGBA1C 5.6 06/01/2020   HGBA1C 5.7 (H) 02/11/2020   HGBA1C 5.7 (H) 08/29/2019   Lab Results  Component Value Date   INSULIN 13.0 06/01/2020   INSULIN 13.2 02/11/2020   Lab Results  Component Value Date   TSH 2.340 06/01/2020   Lab Results  Component Value Date   CHOL 193 06/01/2020   HDL 53 06/01/2020   LDLCALC 120 (H) 06/01/2020   TRIG 110 06/01/2020   CHOLHDL 3.9 02/11/2020   Lab Results  Component Value Date   WBC 8.7 06/01/2020   HGB 12.9 06/01/2020   HCT 40.0 06/01/2020   MCV 80 06/01/2020   PLT 303 06/01/2020   Lab Results  Component Value Date   IRON 57 06/01/2020   TIBC 377 06/01/2020   FERRITIN 28 06/01/2020   Attestation Statements:   Reviewed by clinician on day of visit: allergies, medications, problem list, medical history, surgical history, family history, social history, and previous encounter notes.  I, Water quality scientist, CMA, am acting as transcriptionist for Briscoe Deutscher, DO  I have reviewed the above documentation for accuracy and completeness, and I agree with the above. Briscoe Deutscher, DO

## 2020-12-18 ENCOUNTER — Other Ambulatory Visit (INDEPENDENT_AMBULATORY_CARE_PROVIDER_SITE_OTHER): Payer: Self-pay | Admitting: Family Medicine

## 2020-12-18 DIAGNOSIS — R7303 Prediabetes: Secondary | ICD-10-CM

## 2020-12-21 ENCOUNTER — Encounter (INDEPENDENT_AMBULATORY_CARE_PROVIDER_SITE_OTHER): Payer: Self-pay

## 2020-12-21 NOTE — Telephone Encounter (Signed)
Message sent to pt.

## 2021-01-06 ENCOUNTER — Encounter (INDEPENDENT_AMBULATORY_CARE_PROVIDER_SITE_OTHER): Payer: Self-pay | Admitting: Family Medicine

## 2021-01-06 ENCOUNTER — Ambulatory Visit (INDEPENDENT_AMBULATORY_CARE_PROVIDER_SITE_OTHER): Payer: PRIVATE HEALTH INSURANCE | Admitting: Family Medicine

## 2021-01-06 ENCOUNTER — Other Ambulatory Visit: Payer: Self-pay

## 2021-01-06 VITALS — BP 120/71 | HR 76 | Temp 98.3°F | Ht 64.0 in | Wt 224.0 lb

## 2021-01-06 DIAGNOSIS — Z72821 Inadequate sleep hygiene: Secondary | ICD-10-CM | POA: Diagnosis not present

## 2021-01-06 DIAGNOSIS — E8881 Metabolic syndrome: Secondary | ICD-10-CM | POA: Diagnosis not present

## 2021-01-06 DIAGNOSIS — R632 Polyphagia: Secondary | ICD-10-CM | POA: Diagnosis not present

## 2021-01-06 DIAGNOSIS — Z9189 Other specified personal risk factors, not elsewhere classified: Secondary | ICD-10-CM

## 2021-01-06 DIAGNOSIS — K219 Gastro-esophageal reflux disease without esophagitis: Secondary | ICD-10-CM

## 2021-01-06 DIAGNOSIS — Z6838 Body mass index (BMI) 38.0-38.9, adult: Secondary | ICD-10-CM

## 2021-01-06 MED ORDER — METFORMIN HCL 500 MG PO TABS: 500.0000 mg | ORAL_TABLET | Freq: Two times a day (BID) | ORAL | 0 refills | Status: DC

## 2021-01-06 MED ORDER — PHENTERMINE HCL 37.5 MG PO TABS: ORAL_TABLET | ORAL | 0 refills | Status: DC

## 2021-01-12 NOTE — Progress Notes (Signed)
Chief Complaint:   OBESITY Kelly Bruce is here to discuss her progress with her obesity treatment plan along with follow-up of her obesity related diagnoses.   Today's visit was #: 13 Starting weight: 242 lbs Starting date: 02/11/2020 Today's weight: 224 lbs Today's date: 01/06/2021 Total lbs lost to date: 18 lbs Body mass index is 38.45 kg/m.  Total weight loss percentage to date: -7.44%  Interim History: Kelly Bruce has increased her weights.  She says she got 8 hours of sleep last night.  She notes that she is doing better with food choices/exercise with restful sleep.  She says she had a recent job interview for a tenure position.  Nutrition Plan: Category 1 Plan for 75% of the time. Anti-obesity medications: phentermine and metformin. Reported side effects: None. Activity: Walking 6,000-7,000 steps per day / strength training for 15 minutes 2-7 times per week. Number of hours slept: 8 hours last night.   Assessment/Plan:   1. Polyphagia Controlled. Current treatment: phentermine 18.75-37.5 mg daily and metformin 500 mg twice daily. Polyphagia refers to excessive feelings of hunger. She will continue to focus on protein-rich, low simple carbohydrate foods. We reviewed the importance of hydration, regular exercise for stress reduction, and restorative sleep.  Having again reminded the patient of the "off label" use of Phentermine beyond three consecutive months, and again discussing the risks, benefits, contraindications, and limitations of it's use; given it's role in the successful treatment of obesity thus far and lack of adverse effect, patient has expressed desire and given informed verbal consent to continue use.   I have consulted the [place] Controlled Substances Registry for this patient, and feel the risk/benefit ratio today is favorable for proceeding with this prescription for a controlled substance. The patient understands monitoring parameters and red flags.   - Refill  phentermine (ADIPEX-P) 37.5 MG tablet; 1/2 to 1 tab daily  Dispense: 30 tablet; Refill: 0  2. Gastroesophageal reflux disease, unspecified whether esophagitis present [name] takes Prilosec 20 mg daily.  Plan:  Continue Prilosec.  We reviewed the diagnosis of GERD and importance of treatment. We discussed "red flag" symptoms and the importance of follow up if symptoms persisted despite treatment. We reviewed non-pharmacologic management of GERD symptoms: including: caffeine reduction, dietary changes, elevate HOB, NPO after supper, reduction of alcohol intake, tobacco cessation, and weight loss.  3. Poor sleep hygiene This is moderately controlled.   Plan: Recommend sleep hygiene measures including regular sleep schedule, optimal sleep environment, and relaxing presleep rituals.    4. Insulin resistance Improving, but not optimized. Goal is HgbA1c < 5.7, fasting insulin closer to 5.  Medication: metformin 500 mg twice daily.    Plan:  She will continue to focus on protein-rich, low simple carbohydrate foods. We reviewed the importance of hydration, regular exercise for stress reduction, and restorative sleep.  Will refill metformin today, as per below.  Lab Results  Component Value Date   HGBA1C 5.6 06/01/2020   Lab Results  Component Value Date   INSULIN 13.0 06/01/2020   INSULIN 13.2 02/11/2020   - Refill metFORMIN (GLUCOPHAGE) 500 MG tablet; Take 1 tablet (500 mg total) by mouth 2 (two) times daily with a meal.  Dispense: 60 tablet; Refill: 0  5. At risk for constipation Kelly Bruce is at increased risk for constipation due to inadequate water intake, changes in diet, and/or use of certain medications. Patient was provided with 15 minutes of counseling today regarding this condition and related ones.  We discussed preventative OTC  therapies that exist such as MiraLAX, stool softeners, etc.   6. Class 2 severe obesity with serious comorbidity and body mass index (BMI) of 38.0 to  38.9 in adult, unspecified obesity type ([place])  Course: Kelly Bruce is currently in the action stage of change. As such, her goal is to continue with weight loss efforts.   Nutrition goals: She has agreed to the Category 1 Plan.   Exercise goals: For substantial health benefits, adults should do at least 150 minutes (2 hours and 30 minutes) a week of moderate-intensity, or 75 minutes (1 hour and 15 minutes) a week of vigorous-intensity aerobic physical activity, or an equivalent combination of moderate- and vigorous-intensity aerobic activity. Aerobic activity should be performed in episodes of at least 10 minutes, and preferably, it should be spread throughout the week.  Behavioral modification strategies: increasing lean protein intake, decreasing simple carbohydrates, increasing vegetables and increasing water intake.  [name] has agreed to follow-up with our clinic in 4 weeks. She was informed of the importance of frequent follow-up visits to maximize her success with intensive lifestyle modifications for her multiple health conditions.   Objective:   Blood pressure 120/71, pulse 76, temperature 98.3 F (36.8 C), temperature source Oral, height 5\' 4"  (1.626 m), weight 224 lb (101.6 kg), SpO2 95 %. Body mass index is 38.45 kg/m.  General: Cooperative, alert, well developed, in no acute distress. HEENT: Conjunctivae and lids unremarkable. Cardiovascular: Regular rhythm.  Lungs: Normal work of breathing. Neurologic: No focal deficits.   Lab Results  Component Value Date   CREATININE 0.61 06/01/2020   BUN 9 06/01/2020   NA 140 06/01/2020   K 3.9 06/01/2020   CL 103 06/01/2020   CO2 23 06/01/2020   Lab Results  Component Value Date   ALT 11 06/01/2020   AST 10 06/01/2020   ALKPHOS 65 06/01/2020   BILITOT 0.3 06/01/2020   Lab Results  Component Value Date   HGBA1C 5.6 06/01/2020   HGBA1C 5.7 (H) 02/11/2020   HGBA1C 5.7 (H) 08/29/2019   Lab Results  Component Value Date    INSULIN 13.0 06/01/2020   INSULIN 13.2 02/11/2020   Lab Results  Component Value Date   TSH 2.340 06/01/2020   Lab Results  Component Value Date   CHOL 193 06/01/2020   HDL 53 06/01/2020   LDLCALC 120 (H) 06/01/2020   TRIG 110 06/01/2020   CHOLHDL 3.9 02/11/2020   Lab Results  Component Value Date   WBC 8.7 06/01/2020   HGB 12.9 06/01/2020   HCT 40.0 06/01/2020   MCV 80 06/01/2020   PLT 303 06/01/2020   Lab Results  Component Value Date   IRON 57 06/01/2020   TIBC 377 06/01/2020   FERRITIN 28 06/01/2020   Attestation Statements:   Reviewed by clinician on day of visit: allergies, medications, problem list, medical history, surgical history, family history, social history, and previous encounter notes.  I, Water quality scientist, CMA, am acting as transcriptionist for Briscoe Deutscher, DO  I have reviewed the above documentation for accuracy and completeness, and I agree with the above. Briscoe Deutscher, DO

## 2021-01-31 ENCOUNTER — Other Ambulatory Visit (INDEPENDENT_AMBULATORY_CARE_PROVIDER_SITE_OTHER): Payer: Self-pay | Admitting: Family Medicine

## 2021-01-31 DIAGNOSIS — E8881 Metabolic syndrome: Secondary | ICD-10-CM

## 2021-02-01 ENCOUNTER — Encounter (INDEPENDENT_AMBULATORY_CARE_PROVIDER_SITE_OTHER): Payer: Self-pay

## 2021-02-01 NOTE — Telephone Encounter (Signed)
Message sent to pt.

## 2021-02-10 ENCOUNTER — Ambulatory Visit (INDEPENDENT_AMBULATORY_CARE_PROVIDER_SITE_OTHER): Payer: PRIVATE HEALTH INSURANCE | Admitting: Family Medicine

## 2021-02-17 ENCOUNTER — Other Ambulatory Visit: Payer: Self-pay

## 2021-02-17 ENCOUNTER — Ambulatory Visit (INDEPENDENT_AMBULATORY_CARE_PROVIDER_SITE_OTHER): Payer: PRIVATE HEALTH INSURANCE | Admitting: Family Medicine

## 2021-02-17 VITALS — BP 122/76 | HR 68 | Temp 98.7°F | Ht 64.0 in | Wt 226.0 lb

## 2021-02-17 DIAGNOSIS — R632 Polyphagia: Secondary | ICD-10-CM

## 2021-02-17 DIAGNOSIS — F3289 Other specified depressive episodes: Secondary | ICD-10-CM | POA: Diagnosis not present

## 2021-02-17 DIAGNOSIS — E8881 Metabolic syndrome: Secondary | ICD-10-CM | POA: Diagnosis not present

## 2021-02-17 DIAGNOSIS — Z9189 Other specified personal risk factors, not elsewhere classified: Secondary | ICD-10-CM | POA: Diagnosis not present

## 2021-02-17 DIAGNOSIS — Z6841 Body Mass Index (BMI) 40.0 and over, adult: Secondary | ICD-10-CM

## 2021-02-17 MED ORDER — METFORMIN HCL 500 MG PO TABS: 500.0000 mg | ORAL_TABLET | Freq: Two times a day (BID) | ORAL | 0 refills | Status: DC

## 2021-02-18 ENCOUNTER — Other Ambulatory Visit: Payer: Self-pay | Admitting: Osteopathic Medicine

## 2021-02-18 DIAGNOSIS — Z1231 Encounter for screening mammogram for malignant neoplasm of breast: Secondary | ICD-10-CM

## 2021-02-19 ENCOUNTER — Ambulatory Visit (HOSPITAL_BASED_OUTPATIENT_CLINIC_OR_DEPARTMENT_OTHER)
Admission: RE | Admit: 2021-02-19 | Discharge: 2021-02-19 | Disposition: A | Discharge status: Home / Self Care | Payer: PRIVATE HEALTH INSURANCE | Source: Ambulatory Visit | Attending: Osteopathic Medicine | Admitting: Osteopathic Medicine

## 2021-02-19 ENCOUNTER — Other Ambulatory Visit: Payer: Self-pay

## 2021-02-19 ENCOUNTER — Encounter (INDEPENDENT_AMBULATORY_CARE_PROVIDER_SITE_OTHER): Payer: Self-pay | Admitting: Family Medicine

## 2021-02-19 DIAGNOSIS — Z1231 Encounter for screening mammogram for malignant neoplasm of breast: Secondary | ICD-10-CM | POA: Diagnosis not present

## 2021-02-23 MED ORDER — BUPROPION HCL ER (SR) 150 MG PO TB12
150.0000 mg | ORAL_TABLET | Freq: Every morning | ORAL | 0 refills | Status: DC
Start: 2021-02-23 — End: 2021-04-12

## 2021-02-24 NOTE — Progress Notes (Signed)
Chief Complaint:   OBESITY Kelly Bruce is here to discuss her progress with her obesity treatment plan along with follow-up of her obesity related diagnoses. Kelly Bruce is on the Category 1 Plan and states she is following her eating plan approximately 75% of the time. Kelly Bruce states she is doing weights for 15-20 minutes 3 times per week.  Today's visit was #: 14 Starting weight: 242 lbs Starting date: 02/11/2020 Today's weight: 226 lbs Today's date: 02/17/2021 Total lbs lost to date: 16 Total lbs lost since last in-office visit: 0  Interim History: Kelly Bruce is struggling with weight loss and with her eating plan. She is not very fond of meat and her protein seems to be decreasing which likely is decreasing her RMR. She has been on phentermine but no weight loss for a while so likely she is building a tolerance to the medicine. She feels she has increased cravings/neurological hunger.  Subjective:   1. Insulin resistance Kelly Bruce is stable on metformin, and she denies nausea or vomiting.  2. Other depression, with emotional eating Kelly Bruce notes increased cravings and emotional eating behaviors especially in the evenings. She is open to looking at other medication options to help with this.  3. At risk for diabetes mellitus Kelly Bruce is at higher than average risk for developing diabetes due to obesity.   Assessment/Plan:   1. Insulin resistance Kelly Bruce will continue to work on weight loss, exercise, and decreasing simple carbohydrates to help decrease the risk of diabetes. We will refill metformin for 1 month. Kelly Bruce agreed to follow-up with [place] as directed to closely monitor her progress.  - metFORMIN (GLUCOPHAGE) 500 MG tablet; Take 1 tablet (500 mg total) by mouth 2 (two) times daily with a meal.  Dispense: 60 tablet; Refill: 0  2. Other depression, with emotional eating Behavior modification techniques were discussed today to help Kelly Bruce deal with her emotional/non-hunger eating  behaviors. Kelly Bruce agreed to start Wellbutrin SR 150 mg q AM with no refills. Orders and follow up as documented in patient record.   - buPROPion (WELLBUTRIN SR) 150 MG 12 hr tablet; Take 1 tablet (150 mg total) by mouth in the morning.  Dispense: 30 tablet; Refill: 0  3. At risk for diabetes mellitus Kelly Bruce was given approximately 15 minutes of diabetes education and counseling today. We discussed intensive lifestyle modifications today with an emphasis on weight loss as well as increasing exercise and decreasing simple carbohydrates in her diet. We also reviewed medication options with an emphasis on risk versus benefit of those discussed.   Repetitive spaced learning was employed today to elicit superior memory formation and behavioral change.  4. Obesity BMI today 55 Kelly Bruce is currently in the action stage of change. As such, her goal is to continue with weight loss efforts. She has agreed to change to keeping a food journal and adhering to recommended goals of 1000-1200 calories and 75+ grams of protein daily.   We discussed various medication options to help Kelly Bruce with her weight loss efforts and we both agreed to discontinue phentermine and will work on emotional eating behaviors.  Exercise goals: As is.  Behavioral modification strategies: increasing lean protein intake and emotional eating strategies.  Kelly Bruce has agreed to follow-up with our clinic in 3 weeks. She was informed of the importance of frequent follow-up visits to maximize her success with intensive lifestyle modifications for her multiple health conditions.   Objective:   Blood pressure 122/76, pulse 68, temperature 98.7 F (37.1 C), height 5\' 4"  (  1.626 m), weight 226 lb (102.5 kg), SpO2 99 %. Body mass index is 38.79 kg/m.  General: Cooperative, alert, well developed, in no acute distress. HEENT: Conjunctivae and lids unremarkable. Cardiovascular: Regular rhythm.  Lungs: Normal work of breathing. Neurologic:  No focal deficits.   Lab Results  Component Value Date   CREATININE 0.61 06/01/2020   BUN 9 06/01/2020   NA 140 06/01/2020   K 3.9 06/01/2020   CL 103 06/01/2020   CO2 23 06/01/2020   Lab Results  Component Value Date   ALT 11 06/01/2020   AST 10 06/01/2020   ALKPHOS 65 06/01/2020   BILITOT 0.3 06/01/2020   Lab Results  Component Value Date   HGBA1C 5.6 06/01/2020   HGBA1C 5.7 (H) 02/11/2020   HGBA1C 5.7 (H) 08/29/2019   Lab Results  Component Value Date   INSULIN 13.0 06/01/2020   INSULIN 13.2 02/11/2020   Lab Results  Component Value Date   TSH 2.340 06/01/2020   Lab Results  Component Value Date   CHOL 193 06/01/2020   HDL 53 06/01/2020   LDLCALC 120 (H) 06/01/2020   TRIG 110 06/01/2020   CHOLHDL 3.9 02/11/2020   Lab Results  Component Value Date   WBC 8.7 06/01/2020   HGB 12.9 06/01/2020   HCT 40.0 06/01/2020   MCV 80 06/01/2020   PLT 303 06/01/2020   Lab Results  Component Value Date   IRON 57 06/01/2020   TIBC 377 06/01/2020   FERRITIN 28 06/01/2020   Attestation Statements:   Reviewed by clinician on day of visit: allergies, medications, problem list, medical history, surgical history, family history, social history, and previous encounter notes.   I, Trixie Dredge, am acting as transcriptionist for Dennard Nip, MD.  I have reviewed the above documentation for accuracy and completeness, and I agree with the above. -  Dennard Nip, MD

## 2021-03-21 ENCOUNTER — Other Ambulatory Visit (INDEPENDENT_AMBULATORY_CARE_PROVIDER_SITE_OTHER): Payer: Self-pay | Admitting: Family Medicine

## 2021-03-21 DIAGNOSIS — F3289 Other specified depressive episodes: Secondary | ICD-10-CM

## 2021-03-22 NOTE — Telephone Encounter (Signed)
Pt last seen by Dr. Beasley.  

## 2021-03-24 NOTE — Telephone Encounter (Signed)
Denied, ntbs

## 2021-03-28 ENCOUNTER — Encounter (INDEPENDENT_AMBULATORY_CARE_PROVIDER_SITE_OTHER): Payer: Self-pay | Admitting: Family Medicine

## 2021-03-31 ENCOUNTER — Ambulatory Visit (INDEPENDENT_AMBULATORY_CARE_PROVIDER_SITE_OTHER): Payer: PRIVATE HEALTH INSURANCE | Admitting: Family Medicine

## 2021-04-12 ENCOUNTER — Encounter (INDEPENDENT_AMBULATORY_CARE_PROVIDER_SITE_OTHER): Payer: Self-pay | Admitting: Family Medicine

## 2021-04-12 ENCOUNTER — Other Ambulatory Visit: Payer: Self-pay

## 2021-04-12 ENCOUNTER — Ambulatory Visit (INDEPENDENT_AMBULATORY_CARE_PROVIDER_SITE_OTHER): Payer: PRIVATE HEALTH INSURANCE | Admitting: Family Medicine

## 2021-04-12 VITALS — BP 111/73 | HR 70 | Temp 98.3°F | Ht 64.0 in | Wt 231.0 lb

## 2021-04-12 DIAGNOSIS — F3289 Other specified depressive episodes: Secondary | ICD-10-CM

## 2021-04-12 DIAGNOSIS — E559 Vitamin D deficiency, unspecified: Secondary | ICD-10-CM | POA: Diagnosis not present

## 2021-04-12 DIAGNOSIS — E8881 Metabolic syndrome: Secondary | ICD-10-CM

## 2021-04-12 DIAGNOSIS — Z9189 Other specified personal risk factors, not elsewhere classified: Secondary | ICD-10-CM | POA: Diagnosis not present

## 2021-04-12 DIAGNOSIS — Z6841 Body Mass Index (BMI) 40.0 and over, adult: Secondary | ICD-10-CM

## 2021-04-12 MED ORDER — BUPROPION HCL ER (SR) 150 MG PO TB12: 150.0000 mg | ORAL_TABLET | Freq: Every morning | ORAL | 3 refills | Status: AC

## 2021-04-12 MED ORDER — METFORMIN HCL 500 MG PO TABS
500.0000 mg | ORAL_TABLET | Freq: Two times a day (BID) | ORAL | 3 refills | Status: AC
Start: 2021-04-12 — End: ?

## 2021-04-15 NOTE — Progress Notes (Signed)
Chief Complaint:   OBESITY Kelly Bruce is here to discuss her progress with her obesity treatment plan along with follow-up of her obesity related diagnoses.   Today's visit was #: 15 Starting weight: 242 lbs Starting date: 02/11/2020 Today's weight: 231 lbs Today's date: 04/12/2021 Weight change since last visit: +5 lbs Total lbs lost to date: 11 lbs Body mass index is 39.65 kg/m.  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  Will be moving prior to August.  She says the medications are helping.  She is up 5 pounds of muscle. Current Meal Plan: keeping a food journal and adhering to recommended goals of 1000-1200 calories and 75+ grams of protein for 60% of the time.  Current Exercise Plan: Walking for 20-30 minutes 5 times per week.  Assessment/Plan:   Meds ordered this encounter  Medications  . buPROPion (WELLBUTRIN SR) 150 MG 12 hr tablet    Sig: Take 1 tablet (150 mg total) by mouth in the morning.    Dispense:  90 tablet    Refill:  3  . metFORMIN (GLUCOPHAGE) 500 MG tablet    Sig: Take 1 tablet (500 mg total) by mouth 2 (two) times daily with a meal.    Dispense:  180 tablet    Refill:  3    1. Vitamin D deficiency Not at goal. Current vitamin D is 36.0, tested on 06/01/2020. Optimal goal > 50 ng/dL.   Plan: Follow-up for routine testing of Vitamin D, at least 2-3 times per year to avoid over-replacement.  2. Insulin resistance Not at goal. Goal is HgbA1c < 5.7, fasting insulin closer to 5.  Medication: metformin 500 mg twice daily.  Plan:  She will continue to focus on protein-rich, low simple carbohydrate foods. We reviewed the importance of hydration, regular exercise for stress reduction, and restorative sleep.   Lab Results  Component Value Date   HGBA1C 5.6 06/01/2020   Lab Results  Component Value Date   INSULIN 13.0 06/01/2020   INSULIN 13.2 02/11/2020   - Refill metFORMIN  (GLUCOPHAGE) 500 MG tablet; Take 1 tablet (500 mg total) by mouth 2 (two) times daily with a meal.  Dispense: 180 tablet; Refill: 3  3. Other depression, with emotional eating Controlled. Medication: Wellbutrin 150 mg daily.  Plan:  Behavior modification techniques were discussed today to help deal with emotional/non-hunger eating behaviors.  - Refill buPROPion (WELLBUTRIN SR) 150 MG 12 hr tablet; Take 1 tablet (150 mg total) by mouth in the morning.  Dispense: 90 tablet; Refill: 3  4. At risk for anxiety Kelly Bruce was given approximately 8 minutes of anxiety risk counseling today. The patient has risk factors for this condition.  We discussed the importance of a healthy work/life balance, a healthy relationship with food, and a good support system.  We discussed various strategies to help cope with these emotions as well.  I recommended counseling, meditation, healthy eating habits, sleep hygiene, and exercising to help manage these feelings.   5. Obesity, current BMI 39.7  Course: Kelly Bruce is currently in the action stage of change. As such, her goal is to continue with weight loss efforts.   Nutrition goals: She has agreed to keeping a food journal and adhering to recommended goals of 1000-1200 calories and 75+ grams of protein.   Exercise goals: For substantial health benefits, adults should do at least 150 minutes (2 hours and 30  minutes) a week of moderate-intensity, or 75 minutes (1 hour and 15 minutes) a week of vigorous-intensity aerobic physical activity, or an equivalent combination of moderate- and vigorous-intensity aerobic activity. Aerobic activity should be performed in episodes of at least 10 minutes, and preferably, it should be spread throughout the week.  Behavioral modification strategies: increasing lean protein intake, decreasing simple carbohydrates, increasing vegetables and increasing water intake.  Kelly Bruce has agreed to follow-up with our clinic as needed. She  was informed of the importance of frequent follow-up visits to maximize her success with intensive lifestyle modifications for her multiple health conditions.   Objective:   Blood pressure 111/73, pulse 70, temperature 98.3 F (36.8 C), temperature source Oral, height 5\' 4"  (1.626 m), weight 231 lb (104.8 kg), SpO2 98 %. Body mass index is 39.65 kg/m.  General: Cooperative, alert, well developed, in no acute distress. HEENT: Conjunctivae and lids unremarkable. Cardiovascular: Regular rhythm.  Lungs: Normal work of breathing. Neurologic: No focal deficits.   Lab Results  Component Value Date   CREATININE 0.61 06/01/2020   BUN 9 06/01/2020   NA 140 06/01/2020   K 3.9 06/01/2020   CL 103 06/01/2020   CO2 23 06/01/2020   Lab Results  Component Value Date   ALT 11 06/01/2020   AST 10 06/01/2020   ALKPHOS 65 06/01/2020   BILITOT 0.3 06/01/2020   Lab Results  Component Value Date   HGBA1C 5.6 06/01/2020   HGBA1C 5.7 (H) 02/11/2020   HGBA1C 5.7 (H) 08/29/2019   Lab Results  Component Value Date   INSULIN 13.0 06/01/2020   INSULIN 13.2 02/11/2020   Lab Results  Component Value Date   TSH 2.340 06/01/2020   Lab Results  Component Value Date   CHOL 193 06/01/2020   HDL 53 06/01/2020   LDLCALC 120 (H) 06/01/2020   TRIG 110 06/01/2020   CHOLHDL 3.9 02/11/2020   Lab Results  Component Value Date   WBC 8.7 06/01/2020   HGB 12.9 06/01/2020   HCT 40.0 06/01/2020   MCV 80 06/01/2020   PLT 303 06/01/2020   Lab Results  Component Value Date   IRON 57 06/01/2020   TIBC 377 06/01/2020   FERRITIN 28 06/01/2020   Attestation Statements:   Reviewed by clinician on day of visit: allergies, medications, problem list, medical history, surgical history, family history, social history, and previous encounter notes.  I, Water quality scientist, CMA, am acting as transcriptionist for Briscoe Deutscher, DO  I have reviewed the above documentation for accuracy and completeness, and I agree  with the above. Briscoe Deutscher, DO

## 2021-07-15 ENCOUNTER — Other Ambulatory Visit: Payer: Self-pay | Admitting: Osteopathic Medicine

## 2021-07-15 DIAGNOSIS — K219 Gastro-esophageal reflux disease without esophagitis: Secondary | ICD-10-CM

## 2021-10-11 ENCOUNTER — Other Ambulatory Visit: Payer: Self-pay | Admitting: Osteopathic Medicine

## 2021-10-11 DIAGNOSIS — K219 Gastro-esophageal reflux disease without esophagitis: Secondary | ICD-10-CM

## 2022-01-07 ENCOUNTER — Other Ambulatory Visit: Payer: Self-pay | Admitting: Family Medicine

## 2022-01-07 DIAGNOSIS — K219 Gastro-esophageal reflux disease without esophagitis: Secondary | ICD-10-CM

## 2022-01-26 ENCOUNTER — Other Ambulatory Visit: Payer: Self-pay | Admitting: Family Medicine

## 2022-01-26 DIAGNOSIS — K219 Gastro-esophageal reflux disease without esophagitis: Secondary | ICD-10-CM

## 2022-01-28 ENCOUNTER — Other Ambulatory Visit: Payer: Self-pay | Admitting: Family Medicine

## 2022-01-28 DIAGNOSIS — K219 Gastro-esophageal reflux disease without esophagitis: Secondary | ICD-10-CM

## 2022-06-29 ENCOUNTER — Encounter (INDEPENDENT_AMBULATORY_CARE_PROVIDER_SITE_OTHER): Payer: Self-pay
